# Patient Record
Sex: Female | Born: 1990 | Race: White | Hispanic: No | Marital: Married | Smoking: Never smoker
Health system: Southern US, Community
[De-identification: ages and names within clinical notes are randomized; demographics above are authoritative.]

## PROBLEM LIST (undated history)

## (undated) DIAGNOSIS — Z87898 Personal history of other specified conditions: Secondary | ICD-10-CM

## (undated) DIAGNOSIS — F429 Obsessive-compulsive disorder, unspecified: Secondary | ICD-10-CM

## (undated) DIAGNOSIS — K219 Gastro-esophageal reflux disease without esophagitis: Secondary | ICD-10-CM

## (undated) HISTORY — PX: CERVICAL DISC SURGERY: SHX588

## (undated) HISTORY — PX: WISDOM TOOTH EXTRACTION: SHX21

## (undated) HISTORY — DX: Personal history of other specified conditions: Z87.898

## (undated) HISTORY — PX: ESOPHAGOGASTRODUODENOSCOPY: SHX1529

## (undated) HISTORY — DX: Gastro-esophageal reflux disease without esophagitis: K21.9

## (undated) HISTORY — DX: Obsessive-compulsive disorder, unspecified: F42.9

---

## 2015-03-02 ENCOUNTER — Encounter: Payer: Self-pay | Admitting: Obstetrics and Gynecology

## 2015-03-02 ENCOUNTER — Ambulatory Visit (INDEPENDENT_AMBULATORY_CARE_PROVIDER_SITE_OTHER): Payer: 59 | Admitting: Obstetrics and Gynecology

## 2015-03-02 VITALS — BP 112/60 | HR 64 | Resp 14 | Ht 65.0 in | Wt 124.0 lb

## 2015-03-02 DIAGNOSIS — F42 Obsessive-compulsive disorder: Secondary | ICD-10-CM | POA: Diagnosis not present

## 2015-03-02 DIAGNOSIS — Z01419 Encounter for gynecological examination (general) (routine) without abnormal findings: Secondary | ICD-10-CM | POA: Diagnosis not present

## 2015-03-02 DIAGNOSIS — F429 Obsessive-compulsive disorder, unspecified: Secondary | ICD-10-CM

## 2015-03-02 DIAGNOSIS — Z124 Encounter for screening for malignant neoplasm of cervix: Secondary | ICD-10-CM | POA: Diagnosis not present

## 2015-03-02 MED ORDER — ORSYTHIA 0.1-20 MG-MCG PO TABS: 1.0000 | ORAL_TABLET | Freq: Every day | ORAL | Status: DC

## 2015-03-02 MED ORDER — FLUOXETINE HCL 20 MG PO CAPS: 20.0000 mg | ORAL_CAPSULE | Freq: Every day | ORAL | Status: DC

## 2015-03-02 NOTE — Progress Notes (Signed)
Patient ID: Gina Avila, female   DOB: 1990/11/24, 24 y.o.   MRN: [id number] 24 y.o. G0P0000 MarriedCaucasianF here for annual exam.  The patient is on OCP's. H/O benign breast lump, told fine. Newly married, no dyspareunia.  Period Cycle (Days): 28 Period Duration (Days): 5 days Period Pattern: Regular Menstrual Flow: Moderate Menstrual Control: Maxi pad, Tampon Dysmenorrhea: None Saturates a regular tampon in 2-3 hours, no BTB.  Patient's last menstrual period was 02/15/2015.          Sexually active: Yes.    The current method of family planning is OCP (estrogen/progesterone).    Exercising: No.  The patient does not participate in regular exercise at present. Smoker:  no  Health Maintenance: Pap:  unsure History of abnormal Pap:  no MMG:  Never Colonoscopy:  Never BMD:   Never TDaP:  unsure Gardasil: no    reports that she has never smoked. She has never used smokeless tobacco. She reports that she drinks about 0.6 oz of alcohol per week. She reports that she does not use illicit drugs. 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   Past Medical History  Diagnosis Date  . History of breast lump     Past Surgical History  Procedure Laterality Date  . Wisdom tooth extraction      Current Outpatient Prescriptions  Medication Sig Dispense Refill  . FLUoxetine (PROZAC) 20 MG capsule     . ORSYTHIA 0.1-20 MG-MCG tablet      No current facility-administered medications for this visit.    Family History  Problem Relation Age of Onset  . Breast cancer Mother   . Thyroid disease Mother   . Hypertension Father   . Thyroid disease Maternal Uncle   . Breast cancer Paternal Aunt   . Thyroid disease Maternal Grandmother     Review of Systems  Constitutional: Negative.   HENT: Negative.   Eyes: Negative.   Respiratory: Negative.   Cardiovascular: Negative.   Gastrointestinal: Negative.   Endocrine: Negative.    Genitourinary: Negative.   Musculoskeletal: Negative.   Skin: Negative.   Allergic/Immunologic: Negative.   Neurological: Negative.   Psychiatric/Behavioral: Negative.     Exam:   BP 112/60 mmHg  Pulse 64  Resp 14  Ht 5\' 5"  (1.651 m)  Wt 124 lb (56.246 kg)  BMI 20.63 kg/m2  LMP 02/15/2015  Weight change: @WEIGHTCHANGE @ Height:   Height: 5\' 5"  (165.1 cm)  Ht Readings from Last 3 Encounters:  03/02/15 5\' 5"  (1.651 m)    General appearance: alert, cooperative and appears stated age Head: Normocephalic, without obvious abnormality, atraumatic Neck: no adenopathy, supple, symmetrical, trachea midline and thyroid normal to inspection and palpation Lungs: clear to auscultation bilaterally Breasts: normal appearance, no masses or tenderness Heart: regular rate and rhythm Abdomen: soft, non-tender; bowel sounds normal; no masses,  no organomegaly Extremities: extremities normal, atraumatic, no cyanosis or edema Skin: Skin color, texture, turgor normal. No rashes or lesions Lymph nodes: Cervical, supraclavicular, and axillary nodes normal. No abnormal inguinal nodes palpated Neurologic: Grossly normal   Pelvic: External genitalia:  no lesions              Urethra:  normal appearing urethra with no masses, tenderness or lesions              Bartholins and Skenes: normal                 Vagina: normal appearing vagina with normal color  and discharge, no lesions              Cervix: no lesions               Bimanual Exam:  Uterus:  normal size, contour, position, consistency, mobility, non-tender              Adnexa: no mass, fullness, tenderness               Rectovaginal: Confirms               Anus:  normal sphincter tone, no lesions  Chaperone was present for exam.  A:  Well Woman with normal exam  Contraception, doing well on OCP's  OCD, well controlled on Prozac  P:   Continue OCP's  Pap with reflex hpv  No immediate plans for children, discussed prenatal vitamins if  considering stopping OCP's  Declines STD testing  Continue prozac

## 2015-03-03 LAB — IPS PAP TEST WITH REFLEX TO HPV

## 2015-07-06 ENCOUNTER — Telehealth: Payer: Self-pay

## 2015-07-06 NOTE — Telephone Encounter (Signed)
Fax Request for 90 day supply for Fluoxetine HCL 20 MG capsules.   Original Rx on 03/02/2015 JJ   Returned fax with instructions in comment section " Original Rx written on 03-02-2015 stated Dispense Quanity  #90 caps 3 Refills"   Fax sent successfully//kg

## 2015-08-08 ENCOUNTER — Encounter: Payer: Self-pay | Admitting: Obstetrics and Gynecology

## 2015-08-08 ENCOUNTER — Ambulatory Visit (INDEPENDENT_AMBULATORY_CARE_PROVIDER_SITE_OTHER): Payer: 59 | Admitting: Obstetrics and Gynecology

## 2015-08-08 VITALS — BP 92/60 | HR 80 | Resp 16 | Ht 65.0 in | Wt 126.0 lb

## 2015-08-08 DIAGNOSIS — L918 Other hypertrophic disorders of the skin: Secondary | ICD-10-CM

## 2015-08-08 NOTE — Progress Notes (Signed)
GYNECOLOGY  VISIT   HPI: 25 y.o.   Married  Caucasian  female   G0P0000 with Patient's last menstrual period was 08/02/2015.   here for Breast check. She c/o a 1 month h/o a pimple on her right nipple. Not tender, stable in size.   GYNECOLOGIC HISTORY: Patient's last menstrual period was 08/02/2015. Contraception: OCP Menopausal hormone therapy: None        OB History    Gravida Para Term Preterm AB TAB SAB Ectopic Multiple Living   0 0 0 0 0 0 0 0 0 0          Patient Active Problem List   Diagnosis Date Noted  . OCD (obsessive compulsive disorder)     Past Medical History  Diagnosis Date  . History of breast lump   . OCD (obsessive compulsive disorder)     Past Surgical History  Procedure Laterality Date  . Wisdom tooth extraction      Current Outpatient Prescriptions  Medication Sig Dispense Refill  . FLUoxetine (PROZAC) 20 MG capsule Take 1 capsule (20 mg total) by mouth daily. 90 capsule 3  . ORSYTHIA 0.1-20 MG-MCG tablet Take 1 tablet by mouth daily. 3 Package 3   No current facility-administered medications for this visit.     ALLERGIES: Codeine and Latex  Family History  Problem Relation Age of Onset  . Breast cancer Mother 59  . Thyroid disease Mother   . Hypertension Father   . Thyroid disease Maternal Uncle   . Breast cancer Paternal Aunt   . Thyroid disease Maternal Grandmother     Social History   Social History  . Marital Status: Married    Spouse Name: N/A  . Number of Children: N/A  . Years of Education: N/A   Occupational History  . Not on file.   Social History Main Topics  . Smoking status: Never Smoker   . Smokeless tobacco: Never Used  . Alcohol Use: 0.6 oz/week    1 Standard drinks or equivalent per week  . Drug Use: No  . Sexual Activity:    Partners: Male   Other Topics Concern  . Not on file   Social History Narrative    Review of Systems  Constitutional: Negative.   HENT: Negative.   Eyes: Negative.    Respiratory: Negative.   Cardiovascular: Negative.   Gastrointestinal: Negative.   Genitourinary: Negative.   Musculoskeletal: Negative.   Skin:       Mole like pimple - right breast   Neurological: Negative.   Endo/Heme/Allergies: Negative.   Psychiatric/Behavioral: Negative.     PHYSICAL EXAMINATION:    BP 92/60 mmHg  Pulse 80  Resp 16  Ht 5\' 5"  (1.651 m)  Wt 126 lb (57.153 kg)  BMI 20.97 kg/m2  LMP 08/02/2015    General appearance: alert, cooperative and appears stated age  Breasts: no lumps, dimpling or nipple d/c. She has a small skin tag on her right nipple around 2 o'clock. Benign appearing.   ASSESSMENT Skin tag right nipple FH of breast cancer in her mother at 79   PLAN Patient reassured Discussed breast self exams   An After Visit Summary was printed and given to the patient.

## 2015-10-21 ENCOUNTER — Telehealth: Payer: Self-pay | Admitting: Obstetrics and Gynecology

## 2015-10-21 NOTE — Telephone Encounter (Signed)
Spoke with patient. Patient is currently taking Orsytha for OCP. Reports she went out of town and left her pills pack. Has not taken her pills since Monday 10/17/2015. Started her cycle on 10/17/2015 and reports she is still bleeding. Reports bleeding is like her "normal" cycle. She is due to start her placebo pills on Sunday 10/23/2015. Patient is concerned about when to start her new pack so that she will not have her cycle again next week. Advised she may start a new pack Sunday. Will need to use a BUM during the first 7 days on her new pill. Patient is asking if she can start a new pack today so she is not missing more pills. Advised may start new pack today, but will need to change dates at the top of her pills pack. Advised I will speak with covering physician and return call with any further recommendations. She is agreeable.

## 2015-10-21 NOTE — Telephone Encounter (Signed)
Patient called with questions for the nurse about missing taking some of her birth control pills.

## 2015-10-21 NOTE — Telephone Encounter (Signed)
I agree with your recommendations.  Patient may start her new pill pack today.  Use back up pregnancy prevention as you outlined.

## 2015-10-21 NOTE — Telephone Encounter (Signed)
Left message to call Raquell Richer at [phone]. 

## 2016-01-14 ENCOUNTER — Other Ambulatory Visit: Payer: Self-pay | Admitting: Obstetrics and Gynecology

## 2016-01-16 NOTE — Telephone Encounter (Signed)
Please inform medication refill sent. Please set her up for an annual exam for the end of 9/17 or early 10/17

## 2016-01-16 NOTE — Telephone Encounter (Signed)
Medication refill request: FLUoxetine 20mg  Last AEX:  03/02/15 JJ ; 08/08/15 office visit for Skin tag Next AEX: none scheduled  Last MMG (if hormonal medication request): n/a Refill authorized: 03/02/15 #90 w/3; today please advise

## 2016-02-29 ENCOUNTER — Other Ambulatory Visit: Payer: Self-pay | Admitting: Obstetrics and Gynecology

## 2016-02-29 NOTE — Telephone Encounter (Signed)
1 pack of OCP's sent, please inform if needed Annual exam next week

## 2016-02-29 NOTE — Telephone Encounter (Signed)
Medication refill request: OCP  Last AEX:  03-02-15  Next AEX: 03-08-16 Last MMG (if hormonal medication request): N/A Refill authorized: please advise

## 2016-03-08 ENCOUNTER — Ambulatory Visit: Payer: 59 | Admitting: Obstetrics and Gynecology

## 2016-03-13 ENCOUNTER — Other Ambulatory Visit: Payer: Self-pay | Admitting: Obstetrics and Gynecology

## 2016-03-13 NOTE — Telephone Encounter (Signed)
Medication refill request: Prozac Last AEX:  03-02-15  Next AEX: 03-21-16 Last MMG (if hormonal medication request): N/A Refill authorized: please advise

## 2016-03-21 ENCOUNTER — Encounter: Payer: Self-pay | Admitting: Obstetrics and Gynecology

## 2016-03-21 ENCOUNTER — Ambulatory Visit (INDEPENDENT_AMBULATORY_CARE_PROVIDER_SITE_OTHER): Payer: 59 | Admitting: Obstetrics and Gynecology

## 2016-03-21 VITALS — BP 102/64 | HR 64 | Resp 14 | Ht 65.0 in | Wt 132.0 lb

## 2016-03-21 DIAGNOSIS — Z0000 Encounter for general adult medical examination without abnormal findings: Secondary | ICD-10-CM | POA: Diagnosis not present

## 2016-03-21 DIAGNOSIS — R5383 Other fatigue: Secondary | ICD-10-CM

## 2016-03-21 DIAGNOSIS — Z01419 Encounter for gynecological examination (general) (routine) without abnormal findings: Secondary | ICD-10-CM | POA: Diagnosis not present

## 2016-03-21 DIAGNOSIS — R635 Abnormal weight gain: Secondary | ICD-10-CM | POA: Diagnosis not present

## 2016-03-21 DIAGNOSIS — N941 Unspecified dyspareunia: Secondary | ICD-10-CM

## 2016-03-21 LAB — CBC
HCT: 42.4 % (ref 35.0–45.0)
Hemoglobin: 14.2 g/dL (ref 11.7–15.5)
MCH: 29.9 pg (ref 27.0–33.0)
MCHC: 33.5 g/dL (ref 32.0–36.0)
MCV: 89.3 fL (ref 80.0–100.0)
MPV: 11.3 fL (ref 7.5–12.5)
PLATELETS: 211 10*3/uL (ref 140–400)
RBC: 4.75 MIL/uL (ref 3.80–5.10)
RDW: 13.4 % (ref 11.0–15.0)
WBC: 7.7 10*3/uL (ref 3.8–10.8)

## 2016-03-21 LAB — TSH: TSH: 0.86 mIU/L

## 2016-03-21 MED ORDER — LEVONORGESTREL-ETHINYL ESTRAD 0.1-20 MG-MCG PO TABS
1.0000 | ORAL_TABLET | Freq: Every day | ORAL | 3 refills | Status: DC
Start: 2016-03-21 — End: 2017-02-06

## 2016-03-21 MED ORDER — FLUOXETINE HCL 20 MG PO CAPS: 20.0000 mg | ORAL_CAPSULE | Freq: Every day | ORAL | 3 refills | Status: DC

## 2016-03-21 NOTE — Progress Notes (Signed)
25 y.o. G0P0000 MarriedCaucasianF here for annual exam.  She c/o pain with intercourse, more with entry. Using lubricant, still hurts as the lubricant wears off. Able to orgasm. No immediate plans for babies. Low libido.  She has a 1 year h/o right shoulder pain, some tingling in her right hand. She has a muscle relaxant for prn use. Being evaluated. The tingling is random and short.  She is up 8 lbs in the last year. FH of thyroid dysfunction. Also c/o fatigue.  Period Cycle (Days): 28 Period Duration (Days): 4-5 days  Period Pattern: Regular Menstrual Flow: Moderate Menstrual Control: Maxi pad Dysmenorrhea: None  Patient's last menstrual period was 02/28/2016.          Sexually active: Yes.    The current method of family planning is OCP (estrogen/progesterone).    Exercising: Yes.    running Smoker:  no  Health Maintenance: Pap:  03-02-15 WNL History of abnormal Pap:  no TDaP:  unsure Gardasil: no    reports that she has never smoked. She has never used smokeless tobacco. She reports that she drinks about 2.4 oz of alcohol per week . She reports that she does not use drugs.Speech therapist, working in an elementary school.   Past Medical History:  Diagnosis Date  . History of breast lump   . OCD (obsessive compulsive disorder)     Past Surgical History:  Procedure Laterality Date  . WISDOM TOOTH EXTRACTION      Current Outpatient Prescriptions  Medication Sig Dispense Refill  . FLUoxetine (PROZAC) 20 MG capsule TAKE ONE CAPSULE BY MOUTH EVERY DAY 30 capsule 1  . SRONYX 0.1-20 MG-MCG tablet TAKE 1 TABLET BY MOUTH EVERY DAY 28 tablet 0  . tiZANidine (ZANAFLEX) 4 MG tablet Take 1 tablet twice daily as needed for neck/back stiffness/spasm     No current facility-administered medications for this visit.     Family History  Problem Relation Age of Onset  . Breast cancer Mother 37  . Thyroid disease Mother   . Hypertension Father   . Thyroid disease Maternal Uncle   .  Breast cancer Paternal Aunt   . Thyroid disease Maternal Grandmother   . Osteoporosis Maternal Grandmother     Review of Systems  Constitutional: Negative.   HENT: Negative.   Eyes: Negative.   Respiratory: Negative.   Cardiovascular: Negative.   Gastrointestinal: Negative.   Endocrine: Negative.   Genitourinary: Positive for dyspareunia.       Vaginal dryness  Musculoskeletal: Negative.   Skin: Negative.   Allergic/Immunologic: Negative.   Neurological: Negative.   Psychiatric/Behavioral: Negative.     Exam:   BP 102/64 (BP Location: Right Arm, Patient Position: Sitting, Cuff Size: Normal)   Pulse 64   Resp 14   Ht 5\' 5"  (1.651 m)   Wt 132 lb (59.9 kg)   LMP 02/28/2016   BMI 21.97 kg/m   Weight change: @WEIGHTCHANGE @ Height:   Height: 5\' 5"  (165.1 cm)  Ht Readings from Last 3 Encounters:  03/21/16 5\' 5"  (1.651 m)  08/08/15 5\' 5"  (1.651 m)  03/02/15 5\' 5"  (1.651 m)    General appearance: alert, cooperative and appears stated age Head: Normocephalic, without obvious abnormality, atraumatic Neck: no adenopathy, supple, symmetrical, trachea midline and thyroid normal to inspection and palpation Lungs: clear to auscultation bilaterally Breasts: normal appearance, no masses or tenderness Heart: regular rate and rhythm Abdomen: soft, non-tender; bowel sounds normal; no masses,  no organomegaly Extremities: extremities normal, atraumatic, no cyanosis or edema  Skin: Skin color, texture, turgor normal. No rashes or lesions Lymph nodes: Cervical, supraclavicular, and axillary nodes normal. No abnormal inguinal nodes palpated Neurologic: Grossly normal   Pelvic: External genitalia:  no lesions, no tenderness at the vestibule              Urethra:  normal appearing urethra with no masses, tenderness or lesions              Bartholins and Skenes: normal                 Vagina: normal appearing vagina with normal color and discharge, no lesions              Cervix: no  lesions               Bimanual Exam:  Uterus:  normal size, contour, position, consistency, mobility, non-tender              Adnexa: no mass, fullness, tenderness               Rectovaginal: Confirms               Anus:  normal sphincter tone, no lesions  Chaperone was present for exam.  A:  Well Woman with normal exam  Dyspareunia, entry, no point tenderness  Fatigue and weight gain  Contraception, doing well on OCP's    P:   CBC, CMP, vit D, Lipids and TSH  Discussed breast self exam  Discussed calcium and vit D intake  No pap this year  Discussed increasing foreplay, trying different lubricants and the patient controlling the rate and depth of penetration

## 2016-03-21 NOTE — Patient Instructions (Signed)

## 2016-03-22 ENCOUNTER — Telehealth: Payer: Self-pay | Admitting: Obstetrics and Gynecology

## 2016-03-22 LAB — LIPID PANEL
CHOLESTEROL: 180 mg/dL (ref 125–200)
HDL: 54 mg/dL (ref >=46)
LDL Cholesterol: 107 mg/dL (ref <130)
TRIGLYCERIDES: 96 mg/dL (ref <150)
Total CHOL/HDL Ratio: 3.3 Ratio (ref <=5.0)
VLDL: 19 mg/dL (ref <30)

## 2016-03-22 LAB — COMPREHENSIVE METABOLIC PANEL
ALK PHOS: 51 U/L (ref 33–115)
ALT: 8 U/L (ref 6–29)
AST: 19 U/L (ref 10–30)
Albumin: 4.3 g/dL (ref 3.6–5.1)
BUN: 9 mg/dL (ref 7–25)
CO2: 28 mmol/L (ref 20–31)
Calcium: 9.1 mg/dL (ref 8.6–10.2)
Chloride: 104 mmol/L (ref 98–110)
Creat: 0.82 mg/dL (ref 0.50–1.10)
GLUCOSE: 95 mg/dL (ref 65–99)
POTASSIUM: 3.7 mmol/L (ref 3.5–5.3)
SODIUM: 141 mmol/L (ref 135–146)
Total Bilirubin: 0.5 mg/dL (ref 0.2–1.2)
Total Protein: 7.3 g/dL (ref 6.1–8.1)

## 2016-03-22 LAB — VITAMIN D 25 HYDROXY (VIT D DEFICIENCY, FRACTURES): Vit D, 25-Hydroxy: 40 ng/mL (ref 30–100)

## 2016-03-22 NOTE — Telephone Encounter (Signed)
Patient has some questions regarding her results.

## 2016-03-22 NOTE — Telephone Encounter (Signed)
Spoke with patient. Patient is returning call regarding her results. Unsure if voicemail was regarding pap or lab results. Advised patient pap, TSH, CBC, CMP, Vit D, and lipid panel are all normal. Patient is agreeable and verbalizes understanding.  Routing to provider for final review. Patient agreeable to disposition. Will close encounter.

## 2016-03-28 ENCOUNTER — Other Ambulatory Visit: Payer: Self-pay | Admitting: Obstetrics and Gynecology

## 2016-05-22 ENCOUNTER — Other Ambulatory Visit: Payer: Self-pay | Admitting: Gastroenterology

## 2016-05-22 DIAGNOSIS — R110 Nausea: Secondary | ICD-10-CM

## 2016-10-16 ENCOUNTER — Other Ambulatory Visit: Payer: Self-pay | Admitting: Gastroenterology

## 2016-10-26 ENCOUNTER — Ambulatory Visit (HOSPITAL_COMMUNITY)
Admission: RE | Admit: 2016-10-26 | Discharge: 2016-10-26 | Disposition: A | Discharge status: Home / Self Care | Payer: BC Managed Care – PPO | Source: Ambulatory Visit | Attending: Gastroenterology | Admitting: Gastroenterology

## 2016-10-26 DIAGNOSIS — R110 Nausea: Secondary | ICD-10-CM

## 2016-10-26 DIAGNOSIS — K219 Gastro-esophageal reflux disease without esophagitis: Secondary | ICD-10-CM | POA: Insufficient documentation

## 2016-10-26 DIAGNOSIS — R079 Chest pain, unspecified: Secondary | ICD-10-CM | POA: Insufficient documentation

## 2016-10-26 DIAGNOSIS — R1011 Right upper quadrant pain: Secondary | ICD-10-CM | POA: Diagnosis not present

## 2016-10-26 MED ORDER — TECHNETIUM TC 99M MEBROFENIN IV KIT
5.3000 | PACK | Freq: Once | INTRAVENOUS | Status: AC | PRN
  Administered 2016-10-26: 5.3 via INTRAVENOUS

## 2016-10-31 ENCOUNTER — Other Ambulatory Visit: Payer: Self-pay | Admitting: Gastroenterology

## 2016-10-31 DIAGNOSIS — R935 Abnormal findings on diagnostic imaging of other abdominal regions, including retroperitoneum: Secondary | ICD-10-CM

## 2016-11-15 ENCOUNTER — Other Ambulatory Visit: Payer: BC Managed Care – PPO

## 2016-11-15 ENCOUNTER — Ambulatory Visit
Admission: RE | Admit: 2016-11-15 | Discharge: 2016-11-15 | Disposition: A | Discharge status: Home / Self Care | Payer: BC Managed Care – PPO | Source: Ambulatory Visit | Attending: Gastroenterology | Admitting: Gastroenterology

## 2016-11-15 DIAGNOSIS — R935 Abnormal findings on diagnostic imaging of other abdominal regions, including retroperitoneum: Secondary | ICD-10-CM

## 2016-11-15 MED ORDER — GADOXETATE DISODIUM 0.25 MMOL/ML IV SOLN
5.0000 mL | Freq: Once | INTRAVENOUS | Status: AC | PRN
  Administered 2016-11-15: 5 mL via INTRAVENOUS

## 2017-02-06 ENCOUNTER — Other Ambulatory Visit: Payer: Self-pay | Admitting: Obstetrics and Gynecology

## 2017-02-06 NOTE — Telephone Encounter (Signed)
Medication refill request: OCP   Last AEX:  03-21-16  Next AEX: 03-25-17  Last MMG (if hormonal medication request): N/A Refill authorized: please advise

## 2017-03-20 ENCOUNTER — Ambulatory Visit (INDEPENDENT_AMBULATORY_CARE_PROVIDER_SITE_OTHER): Payer: BC Managed Care – PPO | Admitting: Family Medicine

## 2017-03-20 ENCOUNTER — Encounter: Payer: Self-pay | Admitting: Family Medicine

## 2017-03-20 VITALS — BP 120/70 | HR 66 | Ht 66.0 in | Wt 122.8 lb

## 2017-03-20 DIAGNOSIS — R0982 Postnasal drip: Secondary | ICD-10-CM

## 2017-03-20 DIAGNOSIS — Z7689 Persons encountering health services in other specified circumstances: Secondary | ICD-10-CM

## 2017-03-20 DIAGNOSIS — K219 Gastro-esophageal reflux disease without esophagitis: Secondary | ICD-10-CM | POA: Diagnosis not present

## 2017-03-20 NOTE — Progress Notes (Signed)
   Subjective:    Patient ID: Gina Avila, female    DOB: 11/16/1990, 26 y.o.   MRN: [id number]  HPI Chief Complaint  Patient presents with  . new pt    new pt get established. dullness of pressure in head, she has a history of low blood sugars so has to eat every 3 hours so the dullness helps when she eats   She is new to the practice and here to establish care.  Previous medical care: in [place], [place].  Last CPE: years ago   Complains of feeling "foggy" and having a generalized dull pressure in her head for the past 2 weeks.  Also reports having post nasal drip, ear popping, sore throat, and mild sinus pressure.   Denies fever, chills, body aches, dizziness, rhinorrhea, cough, chest pain, palpitations, shortness of breath, abdominal pain, N/V/D.   No history of seasonal allergies.   States she started taking omeprazole last fall for GERD. Does not feel like the medication is helping and would like to wean off of this.   Other providers:  OB/GYN- Dr. Talbert Nan Dr. Collene Mares- GI  Antionette Char.  [facility] dermatologist.    Reports having gallbladder issues - states she eats a low fat diet and this helps with any symptoms she has been having.    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   Last Menstrual cycle: 03/05/2017  Reviewed allergies, medications, past medical, surgical, family, and social history.   Review of Systems Pertinent positives and negatives in the history of present illness.     Objective:   Physical Exam BP 120/70   Pulse 66   Ht 5\' 6"  (1.676 m)   Wt 122 lb 12.8 oz (55.7 kg)   LMP 03/05/2017   BMI 19.82 kg/m   Alert and in no distress. No sinus tenderness. Nares patent, mild erythema, no discharge. Tympanic membranes and canals are normal. Pharyngeal area is erythematous, no edema or exudate.  Neck is supple without adenopathy or thyromegaly. Cardiac exam shows a regular sinus rhythm without murmurs or gallops. Lungs are  clear to auscultation.      Assessment & Plan:  Post-nasal drainage  Encounter to establish care  Gastroesophageal reflux disease, esophagitis presence not specified  Discussed that post nasal drainage may be related to underlying sinus infection vs allergies. She declines antibiotic for now and would like to try an antihistamine. She will try Claritin and let me know if her symptoms are not improving.  Discussed symptomatic treatment as well such as salt water gargles, tylenol or ibuprofen as needed.  She may cut back on her PPI and try to control GERD with lifestyle and diet.  Follow up as needed.

## 2017-03-20 NOTE — Patient Instructions (Signed)
You can try Claritin and see if this helps with the drainage and symptoms.    For reflux, continue avoiding food triggers and you can try cutting back on the omeprazole as tolerated.

## 2017-03-25 ENCOUNTER — Ambulatory Visit (INDEPENDENT_AMBULATORY_CARE_PROVIDER_SITE_OTHER): Payer: BC Managed Care – PPO | Admitting: Obstetrics and Gynecology

## 2017-03-25 ENCOUNTER — Encounter: Payer: Self-pay | Admitting: Obstetrics and Gynecology

## 2017-03-25 VITALS — BP 100/58 | HR 68 | Resp 14 | Ht 65.0 in | Wt 119.0 lb

## 2017-03-25 DIAGNOSIS — Z3041 Encounter for surveillance of contraceptive pills: Secondary | ICD-10-CM | POA: Diagnosis not present

## 2017-03-25 DIAGNOSIS — Z01419 Encounter for gynecological examination (general) (routine) without abnormal findings: Secondary | ICD-10-CM

## 2017-03-25 MED ORDER — LEVONORGESTREL-ETHINYL ESTRAD 0.1-20 MG-MCG PO TABS: 1.0000 | ORAL_TABLET | Freq: Every day | ORAL | 3 refills | Status: DC

## 2017-03-25 NOTE — Patient Instructions (Signed)
EXERCISE AND DIET:  We recommended that you start or continue a regular exercise program for good health. Regular exercise means any activity that makes your heart beat faster and makes you sweat.  We recommend exercising at least 30 minutes per day at least 3 days a week, preferably 4 or 5.  We also recommend a diet low in fat and sugar.  Inactivity, poor dietary choices and obesity can cause diabetes, heart attack, stroke, and kidney damage, among others.    ALCOHOL AND SMOKING:  Women should limit their alcohol intake to no more than 7 drinks/beers/glasses of wine (combined, not each!) per week. Moderation of alcohol intake to this level decreases your risk of breast cancer and liver damage. And of course, no recreational drugs are part of a healthy lifestyle.  And absolutely no smoking or even second hand smoke. Most people know smoking can cause heart and lung diseases, but did you know it also contributes to weakening of your bones? Aging of your skin?  Yellowing of your teeth and nails?  CALCIUM AND VITAMIN D:  Adequate intake of calcium and Vitamin D are recommended.  The recommendations for exact amounts of these supplements seem to change often, but generally speaking 600 mg of calcium (either carbonate or citrate) and 800 units of Vitamin D per day seems prudent. Certain women may benefit from higher intake of Vitamin D.  If you are among these women, your doctor will have told you during your visit.    PAP SMEARS:  Pap smears, to check for cervical cancer or precancers,  have traditionally been done yearly, although recent scientific advances have shown that most women can have pap smears less often.  However, every woman still should have a physical exam from her gynecologist every year. It will include a breast check, inspection of the vulva and vagina to check for abnormal growths or skin changes, a visual exam of the cervix, and then an exam to evaluate the size and shape of the uterus and  ovaries.  And after 27 years of age, a rectal exam is indicated to check for rectal cancers. We will also provide age appropriate advice regarding health maintenance, like when you should have certain vaccines, screening for sexually transmitted diseases, bone density testing, colonoscopy, mammograms, etc.   MAMMOGRAMS:  All women over 40 years old should have a yearly mammogram. Many facilities now offer a "3D" mammogram, which may cost around $50 extra out of pocket. If possible,  we recommend you accept the option to have the 3D mammogram performed.  It both reduces the number of women who will be called back for extra views which then turn out to be normal, and it is better than the routine mammogram at detecting truly abnormal areas.    COLONOSCOPY:  Colonoscopy to screen for colon cancer is recommended for all women at age 50.  We know, you hate the idea of the prep.  We agree, BUT, having colon cancer and not knowing it is worse!!  Colon cancer so often starts as a polyp that can be seen and removed at colonscopy, which can quite literally save your life!  And if your first colonoscopy is normal and you have no family history of colon cancer, most women don't have to have it again for 10 years.  Once every ten years, you can do something that may end up saving your life, right?  We will be happy to help you get it scheduled when you are ready.    Be sure to check your insurance coverage so you understand how much it will cost.  It may be covered as a preventative service at no cost, but you should check your particular policy.      Breast Self-Awareness Breast self-awareness means being familiar with how your breasts look and feel. It involves checking your breasts regularly and reporting any changes to your health care provider. Practicing breast self-awareness is important. A change in your breasts can be a sign of a serious medical problem. Being familiar with how your breasts look and feel allows  you to find any problems early, when treatment is more likely to be successful. All women should practice breast self-awareness, including women who have had breast implants. How to do a breast self-exam One way to learn what is normal for your breasts and whether your breasts are changing is to do a breast self-exam. To do a breast self-exam: Look for Changes  1. Remove all the clothing above your waist. 2. Stand in front of a mirror in a room with good lighting. 3. Put your hands on your hips. 4. Push your hands firmly downward. 5. Compare your breasts in the mirror. Look for differences between them (asymmetry), such as: ? Differences in shape. ? Differences in size. ? Puckers, dips, and bumps in one breast and not the other. 6. Look at each breast for changes in your skin, such as: ? Redness. ? Scaly areas. 7. Look for changes in your nipples, such as: ? Discharge. ? Bleeding. ? Dimpling. ? Redness. ? A change in position. Feel for Changes  Carefully feel your breasts for lumps and changes. It is best to do this while lying on your back on the floor and again while sitting or standing in the shower or tub with soapy water on your skin. Feel each breast in the following way:  Place the arm on the side of the breast you are examining above your head.  Feel your breast with the other hand.  Start in the nipple area and make  inch (2 cm) overlapping circles to feel your breast. Use the pads of your three middle fingers to do this. Apply light pressure, then medium pressure, then firm pressure. The light pressure will allow you to feel the tissue closest to the skin. The medium pressure will allow you to feel the tissue that is a little deeper. The firm pressure will allow you to feel the tissue close to the ribs.  Continue the overlapping circles, moving downward over the breast until you feel your ribs below your breast.  Move one finger-width toward the center of the body.  Continue to use the  inch (2 cm) overlapping circles to feel your breast as you move slowly up toward your collarbone.  Continue the up and down exam using all three pressures until you reach your armpit.  Write Down What You Find  Write down what is normal for each breast and any changes that you find. Keep a written record with breast changes or normal findings for each breast. By writing this information down, you do not need to depend only on memory for size, tenderness, or location. Write down where you are in your menstrual cycle, if you are still menstruating. If you are having trouble noticing differences in your breasts, do not get discouraged. With time you will become more familiar with the variations in your breasts and more comfortable with the exam. How often should I examine my breasts? Examine   your breasts every month. If you are breastfeeding, the best time to examine your breasts is after a feeding or after using a breast pump. If you menstruate, the best time to examine your breasts is 5-7 days after your period is over. During your period, your breasts are lumpier, and it may be more difficult to notice changes. When should I see my health care provider? See your health care provider if you notice:  A change in shape or size of your breasts or nipples.  A change in the skin of your breast or nipples, such as a reddened or scaly area.  Unusual discharge from your nipples.  A lump or thick area that was not there before.  Pain in your breasts.  Anything that concerns you.  This information is not intended to replace advice given to you by your health care provider. Make sure you discuss any questions you have with your health care provider. Document Released: 05/21/2005 Document Revised: 10/27/2015 Document Reviewed: 04/10/2015 Elsevier Interactive Patient Education  2018 Elsevier Inc.  

## 2017-03-25 NOTE — Progress Notes (Signed)
26 y.o. G0P0000 MarriedCaucasianF here for annual exam.  Doing well on OCP's.  Period Cycle (Days): 28 Period Duration (Days): 7 days  Period Pattern: Regular Menstrual Flow: Moderate Menstrual Control: Thin pad Menstrual Control Change Freq (Hours): changes pad every 3-4 times a day Dysmenorrhea: None  She is still having some pain with intercourse on entry or after a while when the lubricant wears out. If she has enough lubricant she is okay. Using non latex condoms secondary to concern with pregnancy.  She found a lump in her right breast 5 years ago, they saw fibrous changes in the left breast. She is sending a copy of the reports here. She wasn't told she needed f/u imaging.  Mom with breast cancer at 22, triple negative. Mom with negative breast cancer.  PAunt with breast cancer in her 24's.   Patient's last menstrual period was 03/05/2017.          Sexually active: Yes.    The current method of family planning is OCP (estrogen/progesterone).    Exercising: Yes.    beach body  Smoker:  no  Health Maintenance: Pap:  03-02-15 WNL  History of abnormal Pap:  no TDaP:  Unsure, thinks due Gardasil: no    reports that she has never smoked. She has never used smokeless tobacco. She reports that she drinks about 1.2 oz of alcohol per week . She reports that she does not use drugs.Speech therapist in an elementary school.   Past Medical History:  Diagnosis Date  . GERD (gastroesophageal reflux disease)   . History of breast lump   . OCD (obsessive compulsive disorder)     Past Surgical History:  Procedure Laterality Date  . ESOPHAGOGASTRODUODENOSCOPY     Dr. Collene Mares  . WISDOM TOOTH EXTRACTION      Current Outpatient Prescriptions  Medication Sig Dispense Refill  . omeprazole (PRILOSEC) 40 MG capsule Take 40 mg by mouth daily.    . SRONYX 0.1-20 MG-MCG tablet TAKE 1 TABLET BY MOUTH EVERY DAY 84 tablet 0   No current facility-administered medications for this visit.     Family  History  Problem Relation Age of Onset  . Breast cancer Mother 79  . Thyroid disease Mother   . Hypertension Father   . Thyroid disease Maternal Uncle   . Breast cancer Paternal Aunt   . Thyroid disease Maternal Grandmother   . Osteoporosis Maternal Grandmother     Review of Systems  Constitutional: Negative.   HENT: Negative.   Eyes: Negative.   Respiratory: Negative.   Cardiovascular: Negative.   Gastrointestinal: Negative.   Endocrine: Negative.   Genitourinary: Positive for dyspareunia.       Loss of sexual interest   Musculoskeletal: Positive for myalgias.  Skin: Negative.   Allergic/Immunologic: Negative.   Neurological: Positive for headaches.  Psychiatric/Behavioral: Negative.     Exam:   BP (!) 100/58 (BP Location: Right Arm, Patient Position: Sitting, Cuff Size: Normal)   Pulse 68   Resp 14   Ht 5\' 5"  (1.651 m)   Wt 119 lb (54 kg)   LMP 03/05/2017   BMI 19.80 kg/m   Weight change: @WEIGHTCHANGE @ Height:   Height: 5\' 5"  (165.1 cm)  Ht Readings from Last 3 Encounters:  03/25/17 5\' 5"  (1.651 m)  03/20/17 5\' 6"  (1.676 m)  03/21/16 5\' 5"  (1.651 m)    General appearance: alert, cooperative and appears stated age Head: Normocephalic, without obvious abnormality, atraumatic Neck: no adenopathy, supple, symmetrical, trachea midline and thyroid  normal to inspection and palpation Lungs: clear to auscultation bilaterally Cardiovascular: regular rate and rhythm Breasts: normal appearance, no masses or tenderness Abdomen: soft, non-tender; non distended,  no masses,  no organomegaly Extremities: extremities normal, atraumatic, no cyanosis or edema Skin: Skin color, texture, turgor normal. No rashes or lesions Lymph nodes: Cervical, supraclavicular, and axillary nodes normal. No abnormal inguinal nodes palpated Neurologic: Grossly normal   Pelvic: External genitalia:  no lesions              Urethra:  normal appearing urethra with no masses, tenderness or  lesions              Bartholins and Skenes: normal                 Vagina: normal appearing vagina with normal color and discharge, no lesions              Cervix: no lesions               Bimanual Exam:  Uterus:  normal size, contour, position, consistency, mobility, non-tender              Adnexa: no mass, fullness, tenderness               Rectovaginal: Confirms               Anus:  normal sphincter tone, no lesions  Chaperone was present for exam.  A:  Well Woman with normal exam  Contraception, doing well on OCP's  Some entry dyspareunia intermittently, pain if lubricant dries up. May be having some discomfort from the condoms  P:   She will do her labs and get her TDAP with her primary  Continue OCP's  Discussed lubrication, try not using condoms  Discussed breast self exam  Discussed calcium and vit D intake  F/U one year, sooner with concerns  She is having her prior breast imaging sent here for review.

## 2017-04-02 ENCOUNTER — Encounter: Payer: Self-pay | Admitting: Family Medicine

## 2017-04-02 ENCOUNTER — Ambulatory Visit (INDEPENDENT_AMBULATORY_CARE_PROVIDER_SITE_OTHER): Payer: BC Managed Care – PPO | Admitting: Family Medicine

## 2017-04-02 VITALS — BP 120/70 | HR 64 | Ht 66.25 in | Wt 123.2 lb

## 2017-04-02 DIAGNOSIS — Z0000 Encounter for general adult medical examination without abnormal findings: Secondary | ICD-10-CM | POA: Diagnosis not present

## 2017-04-02 DIAGNOSIS — Z8349 Family history of other endocrine, nutritional and metabolic diseases: Secondary | ICD-10-CM

## 2017-04-02 LAB — COMPREHENSIVE METABOLIC PANEL
AG Ratio: 1.4 (calc) (ref 1.0–2.5)
ALKALINE PHOSPHATASE (APISO): 44 U/L (ref 33–115)
ALT: 8 U/L (ref 6–29)
AST: 17 U/L (ref 10–30)
Albumin: 4.2 g/dL (ref 3.6–5.1)
BUN: 11 mg/dL (ref 7–25)
CHLORIDE: 105 mmol/L (ref 98–110)
CO2: 25 mmol/L (ref 20–32)
CREATININE: 0.86 mg/dL (ref 0.50–1.10)
Calcium: 9 mg/dL (ref 8.6–10.2)
Globulin: 2.9 g/dL (calc) (ref 1.9–3.7)
Glucose, Bld: 91 mg/dL (ref 65–99)
Potassium: 4 mmol/L (ref 3.5–5.3)
Sodium: 139 mmol/L (ref 135–146)
Total Bilirubin: 0.3 mg/dL (ref 0.2–1.2)
Total Protein: 7.1 g/dL (ref 6.1–8.1)

## 2017-04-02 LAB — CBC WITH DIFFERENTIAL/PLATELET
BASOS ABS: 40 {cells}/uL (ref 0–200)
Basophils Relative: 0.5 %
EOS ABS: 48 {cells}/uL (ref 15–500)
Eosinophils Relative: 0.6 %
HEMATOCRIT: 41 % (ref 35.0–45.0)
HEMOGLOBIN: 13.8 g/dL (ref 11.7–15.5)
LYMPHS ABS: 2288 {cells}/uL (ref 850–3900)
MCH: 30 pg (ref 27.0–33.0)
MCHC: 33.7 g/dL (ref 32.0–36.0)
MCV: 89.1 fL (ref 80.0–100.0)
MPV: 12.7 fL — ABNORMAL HIGH (ref 7.5–12.5)
Monocytes Relative: 7.6 %
NEUTROS ABS: 5016 {cells}/uL (ref 1500–7800)
NEUTROS PCT: 62.7 %
Platelets: 170 10*3/uL (ref 140–400)
RBC: 4.6 10*6/uL (ref 3.80–5.10)
RDW: 12.5 % (ref 11.0–15.0)
Total Lymphocyte: 28.6 %
WBC mixed population: 608 cells/uL (ref 200–950)
WBC: 8 10*3/uL (ref 3.8–10.8)

## 2017-04-02 LAB — TSH: TSH: 1.12 m[IU]/L

## 2017-04-02 LAB — POCT URINALYSIS DIP (PROADVANTAGE DEVICE)
BILIRUBIN UA: NEGATIVE mg/dL
Bilirubin, UA: NEGATIVE
Glucose, UA: NEGATIVE mg/dL
Nitrite, UA: NEGATIVE
PH UA: 6 (ref 5.0–8.0)
PROTEIN UA: NEGATIVE mg/dL
SPECIFIC GRAVITY, URINE: 1.02
Urobilinogen, Ur: NEGATIVE

## 2017-04-02 NOTE — Progress Notes (Signed)
Subjective:    Patient ID: Gina Avila, female    DOB: 1991/03/16, 26 y.o.   MRN: [id number]  HPI Chief Complaint  Patient presents with  . cpe    fasting cpe, sees obgyn   She is here for a complete physical exam. Taking omeprazole every other day and GERD symptoms are controlled.   Other providers:  OB/GYN- Dr. Talbert Nan Dr. Collene Mares- GI  Antionette Char.  [facility] - [facility] dermatologist.  Social history: Lives with husband, works as Astronomer. Colen Darling degree from [employer] and graduate degree [employer].  Diet: low fat diet  Excerise: 3-4 days per week   Immunizations: up to date. Last Tdap 8 years ago approximately  Health maintenance:  Mammogram: US done for benign tissue abnormalities per patient   Last Gynecological Exam: Pap smear- 2016 Last Menstrual cycle: regular and on OCPs  Last Dental Exam: last week  Last Eye Exam: last year   Wears seatbelt always, uses sunscreen, smoke detectors in home and functioning, does not text while driving and feels safe in home environment.   Reviewed allergies, medications, past medical, surgical, family, and social history.   Review of Systems Review of Systems Constitutional: -fever, -chills, -sweats, -unexpected weight change,-fatigue ENT: -runny nose, -ear pain, -sore throat Cardiology:  -chest pain, -palpitations, -edema Respiratory: -cough, -shortness of breath, -wheezing Gastroenterology: -abdominal pain, -nausea, -vomiting, -diarrhea, -constipation  Hematology: -bleeding or bruising problems Musculoskeletal: -arthralgias, -myalgias, -joint swelling, -back pain Ophthalmology: -vision changes Urology: -dysuria, -difficulty urinating, -hematuria, -urinary frequency, -urgency Neurology: -headache, -weakness, -tingling, -numbness       Objective:   Physical Exam BP 120/70   Pulse 64   Ht 5' 6.25" (1.683 m)   Wt 123 lb 3.2 oz (55.9 kg)   LMP 03/19/2017   BMI 19.74 kg/m   General Appearance:    Alert, cooperative,  no distress, appears stated age  Head:    Normocephalic, without obvious abnormality, atraumatic  Eyes:    PERRL, conjunctiva/corneas clear, EOM's intact, fundi    benign  Ears:    Normal TM's and external ear canals  Nose:   Nares normal, mucosa normal, no drainage or sinus   tenderness  Throat:   Lips, mucosa, and tongue normal; teeth and gums normal  Neck:   Supple, no lymphadenopathy;  thyroid:  no   enlargement/tenderness/nodules; no carotid   bruit or JVD  Back:    Spine nontender, no curvature, ROM normal, no CVA     tenderness  Lungs:     Clear to auscultation bilaterally without wheezes, rales or     ronchi; respirations unlabored  Chest Wall:    No tenderness or deformity   Heart:    Regular rate and rhythm, S1 and S2 normal, no murmur, rub   or gallop  Breast Exam:    Done at OB/GYN  Abdomen:     Soft, non-tender, nondistended, normoactive bowel sounds,    no masses, no hepatosplenomegaly  Genitalia:    Done at OB/GYN  Rectal:    Not performed due to age<40 and no related complaints  Extremities:   No clubbing, cyanosis or edema  Pulses:   2+ and symmetric all extremities  Skin:   Skin color, texture, turgor normal, no rashes or lesions  Lymph nodes:   Cervical, supraclavicular, and axillary nodes normal  Neurologic:   CNII-XII intact, normal strength, sensation and gait; reflexes 2+ and symmetric throughout          Psych:   Normal mood, affect, hygiene  and grooming.    Urinalysis dipstick: trace leuk and blood     Assessment & Plan:  Routine general medical examination at a health care facility - Plan: CBC with Differential/Platelet, Comprehensive metabolic panel, TSH, POCT Urinalysis DIP (Proadvantage Device)  Family history of thyroid disease in mother - Plan: TSH  She appears to be doing well.  Discussed safety and health promotion.  Recommend she continue healthy diet and exercise.  She has an OB/GYN and will follow up as recommended.  Follow up pending labs.

## 2017-04-02 NOTE — Patient Instructions (Signed)

## 2017-06-07 ENCOUNTER — Ambulatory Visit: Payer: BC Managed Care – PPO | Admitting: Family Medicine

## 2017-06-07 ENCOUNTER — Encounter: Payer: Self-pay | Admitting: Family Medicine

## 2017-06-07 VITALS — BP 118/70 | HR 58 | Wt 118.8 lb

## 2017-06-07 DIAGNOSIS — H6691 Otitis media, unspecified, right ear: Secondary | ICD-10-CM

## 2017-06-07 MED ORDER — AMOXICILLIN 875 MG PO TABS: 875.0000 mg | ORAL_TABLET | Freq: Two times a day (BID) | ORAL | 0 refills | Status: DC

## 2017-06-07 NOTE — Progress Notes (Signed)
   Subjective:    Patient ID: Gina Avila, female    DOB: 1991/04/18, 27 y.o.   MRN: [id number]  HPI Chief Complaint  Patient presents with  . water in ear    rt water in ear x 2 weeks    She is here with complaints of a 2 week history of popping in her right ear. States she got water in her right ear in the shower at that time. She is now having pain.   Denies fever, chills, headache, rhinorrhea, nasal congestion, dizziness, sore throat, cough.   Reviewed allergies, medications, past medical, surgical, and social history.   Review of Systems Pertinent positives and negatives in the history of present illness.     Objective:   Physical Exam BP 118/70   Pulse (!) 58   Wt 118 lb 12.8 oz (53.9 kg)   SpO2 98%   BMI 19.03 kg/m   Alert and in no distress.  No sinus tenderness.  Nares patent.  Right TM is cloudy, dull and with erythema, left TM normal, canals are normal. Pharyngeal area is normal. Neck is supple without adenopathy or thyromegaly.       Assessment & Plan:  Acute otitis media, right - Plan: amoxicillin (AMOXIL) 875 MG tablet  Discussed management and treatment of acute otitis media.  Amoxicillin sent to pharmacy.  She will follow-up if not back to baseline after completing antibiotic or if she gets worse.

## 2017-06-26 ENCOUNTER — Ambulatory Visit: Payer: BC Managed Care – PPO | Admitting: Medical

## 2017-07-05 IMAGING — US US ABDOMEN COMPLETE
1 series · 13 of 25 positions shown · non-contrast
Comparison: No prior .

CLINICAL DATA: Nausea.

EXAM:
ABDOMEN ULTRASOUND COMPLETE

[Series 1: us abdomen complete · 0.15mm/px · 109 acquisitions, 13 frames shown]
[im 1/109]
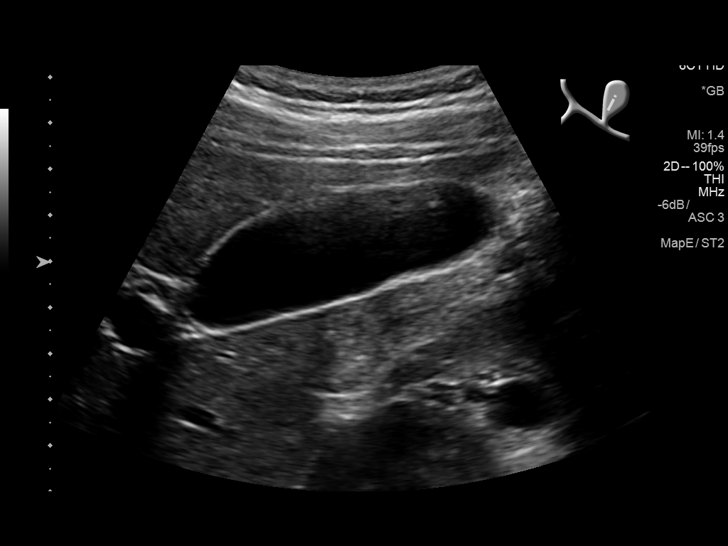
[im 10/109]
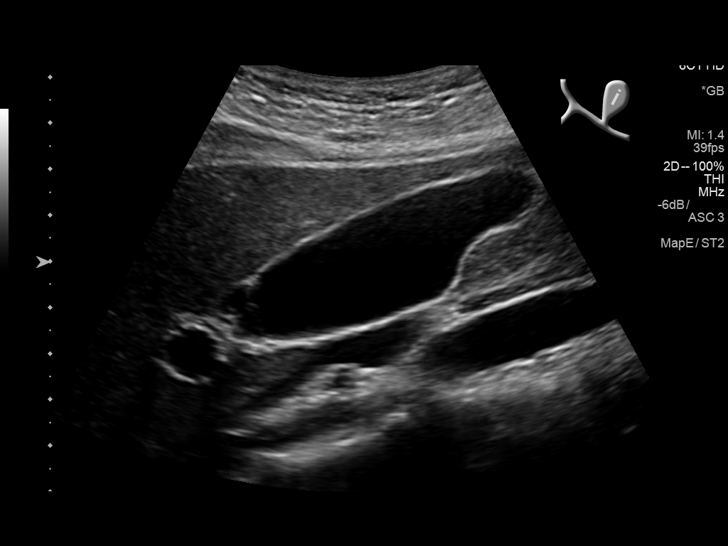
[im 19/109]
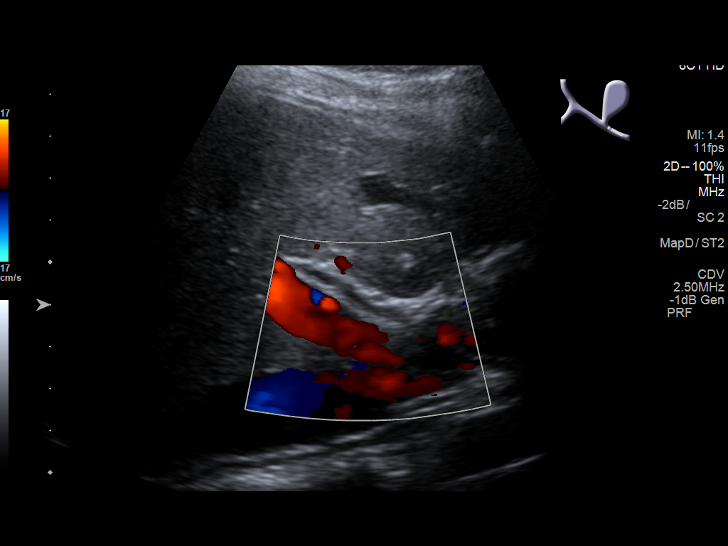
[im 28/109]
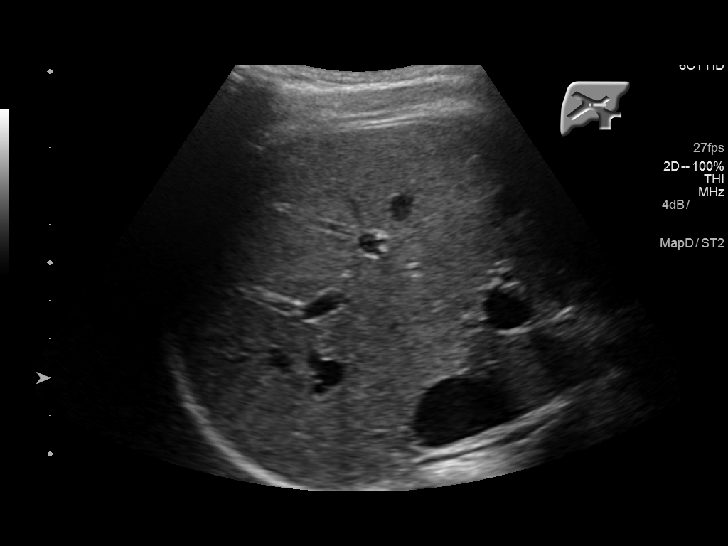
[im 37/109]
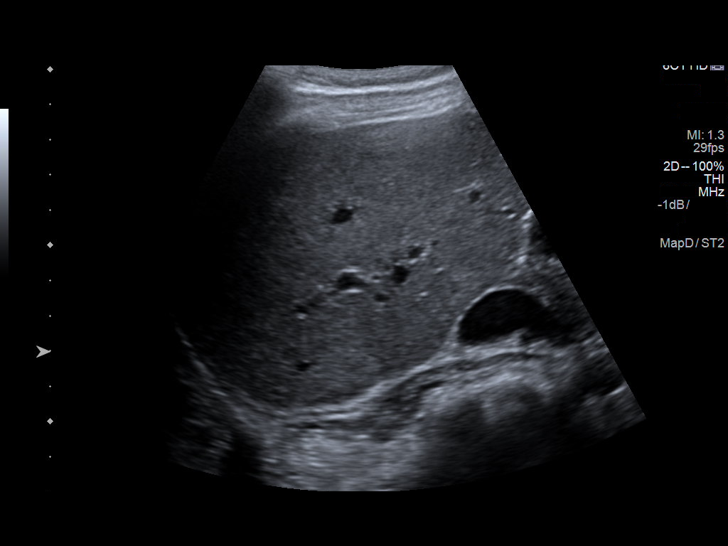
[im 46/109]
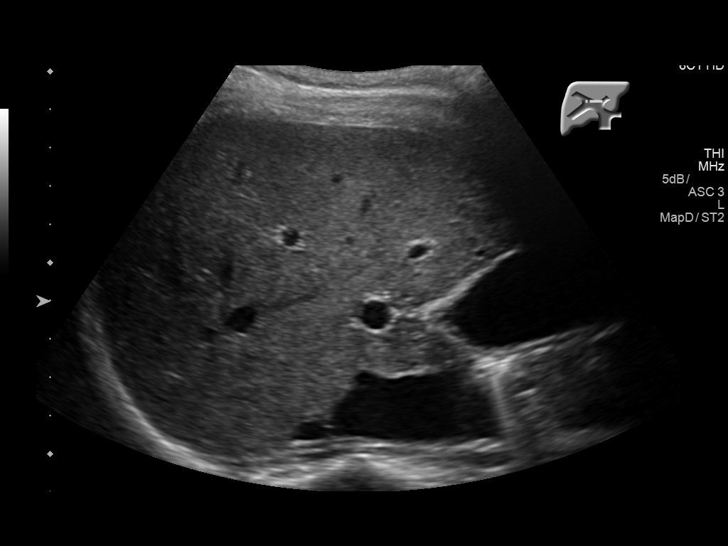
[im 55/109]
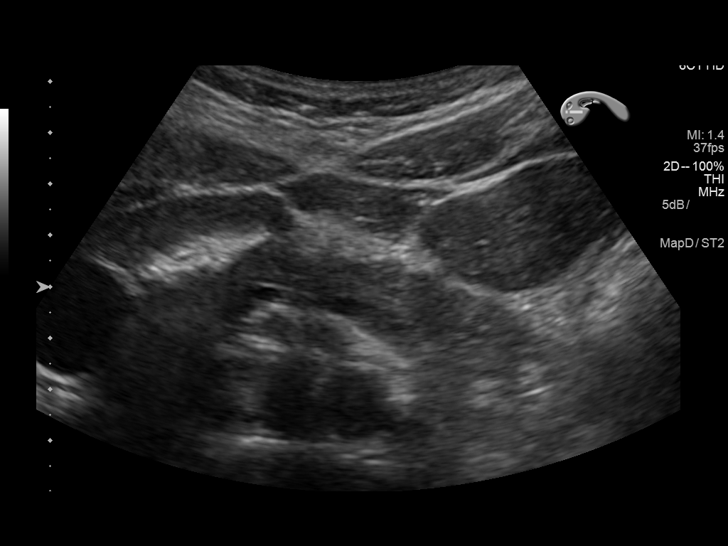
[im 64/109]
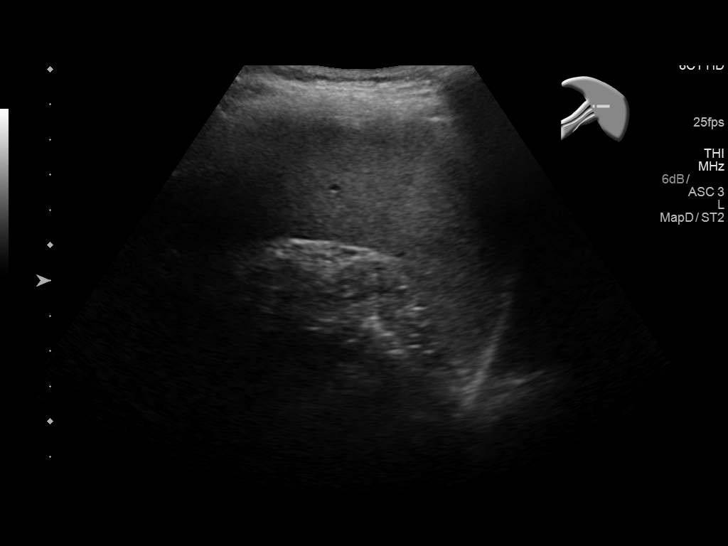
[im 73/109]
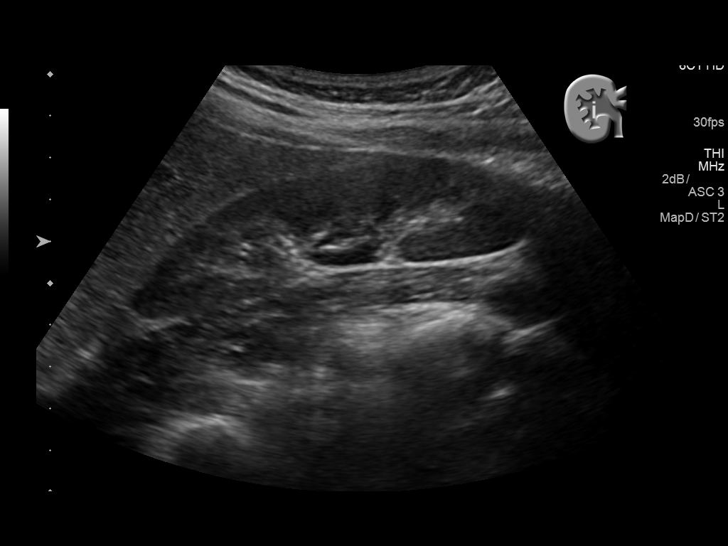
[im 82/109]
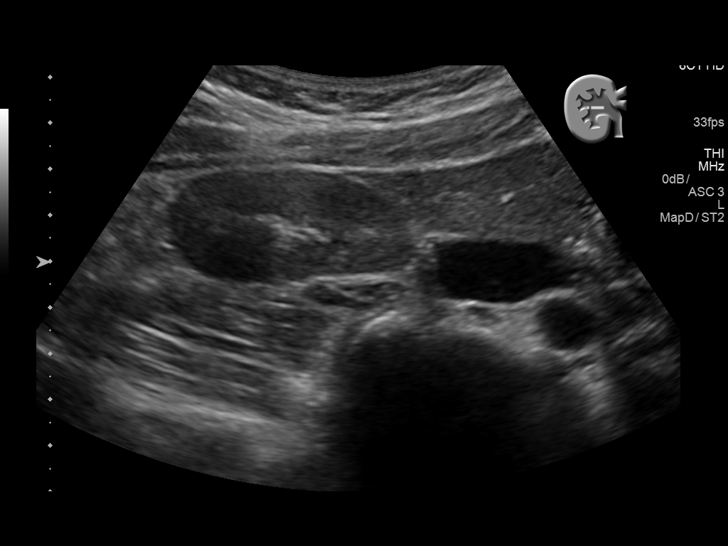
[im 91/109]
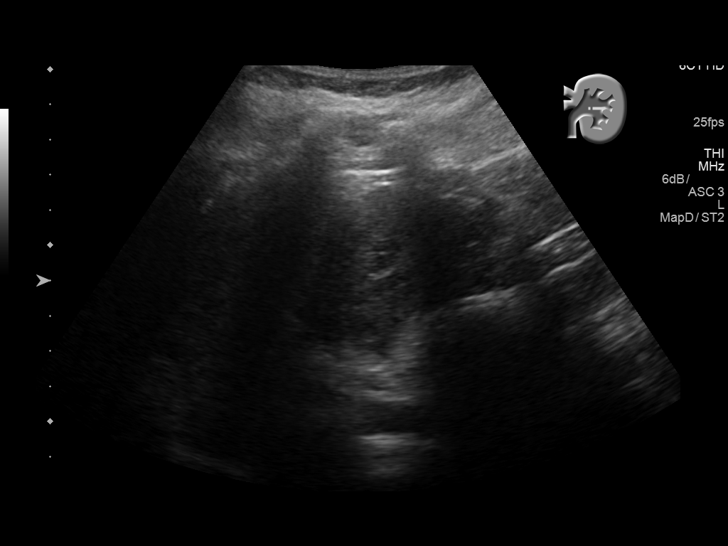
[im 100/109]
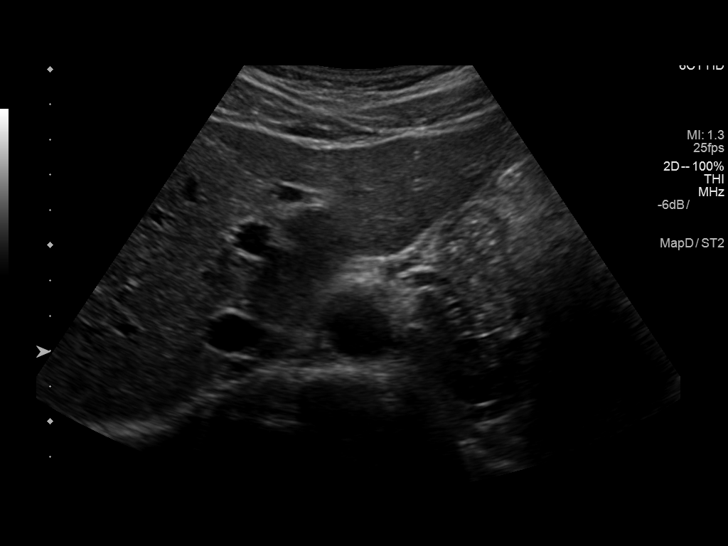
[im 109/109]
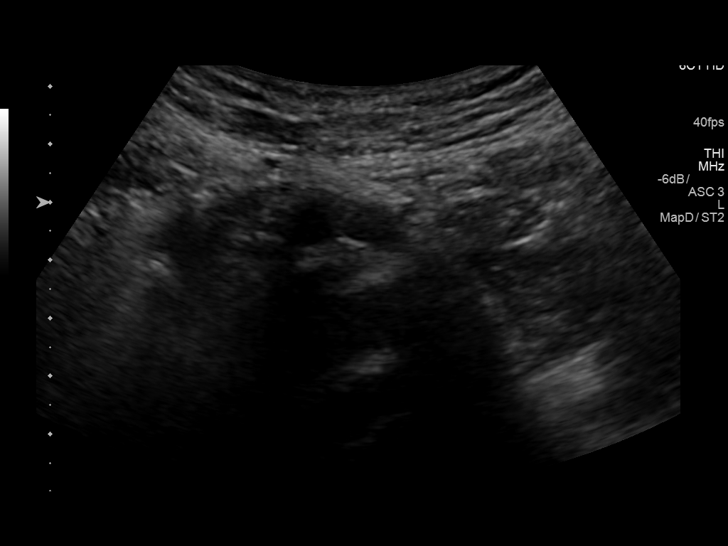

[13 of 25 positions shown; findings below may reference images not displayed]

FINDINGS: Gallbladder: No gallstones or wall thickening visualized. No
sonographic Murphy sign noted by sonographer.

Common bile duct: Diameter: 2.5 mm

Liver: Increased echogenicity consistent fatty infiltration and/or
hepatocellular disease. 2.5 x 1.4 x 2.4 cm hyperechoic
well-circumscribed lesion is in the right hepatic lobe. This is most
likely a benign hemangioma, confirmation with gadolinium-enhanced
MRI can be obtained for further evaluation .

IVC: No abnormality visualized.

Pancreas: Visualized portion unremarkable.

Spleen: Size and appearance within normal limits.

Right Kidney: Length: 10.0 cm. Echogenicity within normal limits. No
mass or hydronephrosis visualized.

Left Kidney: Length: 10.5 cm. Echogenicity within normal limits. No
mass or hydronephrosis visualized.

Abdominal aorta: No aneurysm visualized.

Other findings: None.
IMPRESSION: 1. No acute abnormality. No evidence of gallstones or biliary
distention.

2. 2.5 cm hyperechoic well-circumscribed lesion in the right lobe
liver, most likely a benign hemangioma. Confirmation with
gadolinium-enhanced MRI can be obtained.

## 2017-08-28 ENCOUNTER — Ambulatory Visit: Payer: BC Managed Care – PPO | Admitting: Family Medicine

## 2017-08-28 ENCOUNTER — Ambulatory Visit: Payer: BC Managed Care – PPO | Admitting: Medical

## 2017-08-28 ENCOUNTER — Encounter: Payer: Self-pay | Admitting: Medical

## 2017-08-28 VITALS — BP 120/70 | HR 64 | Temp 97.6°F | Ht 64.0 in | Wt 121.8 lb

## 2017-08-28 DIAGNOSIS — J029 Acute pharyngitis, unspecified: Secondary | ICD-10-CM | POA: Diagnosis not present

## 2017-08-28 DIAGNOSIS — J988 Other specified respiratory disorders: Secondary | ICD-10-CM

## 2017-08-28 NOTE — Progress Notes (Signed)
Subjective: Chief Complaint  Patient presents with  . Acute Visit    sore throat, started yesterday feeling like something was stuck in throat, not hurting right now   Here for runny nose, left ear discomfort, sore throat, started about a week ago.  Has nasal drainage.   Eyes have been puffy.  Hasn't had allergy problems in the past.  No fever, no body aches, no chills.   Maybe occasional cough.  Felt like cold symptoms last week.  Was taking sudafed with some relief.   Drinks a lot of water.   Yesterday felt like pop corn kernel stuck in throat.  Still having drainage from nose.  No strep throat in the past year.  Gets sore throat often,, maybe 5-6 times per year.  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   Thinks she has gotten sore throats with colds.  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  No other aggravating or relieving factors. No other complaint.  Past Medical History:  Diagnosis Date  . GERD (gastroesophageal reflux disease)   . History of breast lump   . OCD (obsessive compulsive disorder)    Current Outpatient Medications on File Prior to Visit  Medication Sig Dispense Refill  . levonorgestrel-ethinyl estradiol (SRONYX) 0.1-20 MG-MCG tablet Take 1 tablet by mouth daily. 84 tablet 3  . Multiple Vitamins-Minerals (MULTIVITAMIN ADULT PO) Take by mouth.    Marland Kitchen amoxicillin (AMOXIL) 875 MG tablet Take 1 tablet (875 mg total) by mouth 2 (two) times daily. (Patient not taking: Reported on 08/28/2017) 20 tablet 0   No current facility-administered medications on file prior to visit.    ROS as in subjective    Objective: BP 120/70 (BP Location: Right Arm, Patient Position: Sitting, Cuff Size: Normal)   Pulse 64   Temp 97.6 F (36.4 C) (Oral)   Ht 5\' 4"  (1.626 m)   Wt 121 lb 12.8 oz (55.2 kg)   SpO2 98%   BMI 20.91 kg/m   General appearance: Alert, WD/WN, no distress                             Skin: warm, no rash, no diaphoresis                           Head: no  sinus tenderness                            Eyes: conjunctiva normal, corneas clear, PERRLA                            Ears: pearly TMs, external ear canals normal                          Nose: septum midline, turbinates mildly swollen, with erythema and clear discharge             Mouth/throat: MMM, tongue normal, mild pharyngeal erythema, tonsils 1+, small white area of left tonsil likely small tonsillith                           Neck: supple, no adenopathy, no thyromegaly, non tender  Heart: RRR, normal S1, S2, no murmurs                         Lungs: clear rhonchi, no wheezes, no rales                Extremities: no edema, non tender       Assessment; Encounter Diagnoses  Name Primary?  Marland Kitchen Respiratory tract infection Yes  . Sore throat      Plan Discussed symptoms.  discussed tonsillith vs exudate.   currently symptoms suggest URI symptoms that are slowly resolving.     Recommendations:  Use salt water gargles the next few days, 2-3 times daily  Use Ibuprofen OTC, 2 or 3 tablets twice daily for the next 3-5 days  Use hot tea, lemon, honey mixture to drink for throat pain and mucous drainage  Use either sudafed or antihistamine such as benadryl or zyrtec the next 4-5 days  Drink plenty of water  If stuffy in the nose, you can do nasal spray or flush  If not feeling more improvement by Friday, call back  Gina Avila was seen today for acute visit.  Diagnoses and all orders for this visit:  Respiratory tract infection  Sore throat

## 2017-08-28 NOTE — Patient Instructions (Signed)
Recommendations:  Use salt water gargles the next few days, 2-3 times daily  Use Ibuprofen OTC, 2 or 3 tablets twice daily for the next 3-5 days  Use hot tea, lemon, honey mixture to drink for throat pain and mucous drainage  Use either sudafed or antihistamine such as benadryl or zyrtec the next 4-5 days  Drink plenty of water  If stuffy in the nose, you can do nasal spray or flush  If not feeling more improvement by Friday, call back   Using Saline Nose Drops with Bulb Syringe A bulb syringe is used to clear your nose. You may use it when you have a stuffy nose, nasal congestion, sinus pressure, or sneezing.   SALINE SOLUTION You can buy nose drops at your local drug store. You can also make nose drops yourself. Mix 1 cup of water with  teaspoon of salt. Stir. Store this mixture at room temperature. Make a new batch daily.  USE THE BULB IN COMBINATION WITH SALINE NOSE DROPS  Squeeze the air out of the bulb before suctioning the saline mixture.  While still squeezing the bulb flat, place the tip of the bulb into the saline mixture.  Let air come back into the bulb.  This will suction up the saline mixture.  Gently flush one nostril at a time.  Salt water nose drops will then moisten your  congested nose and loosen secretions before suctioning.  Use the bulb syringe as directed below to suction.  USING THE BULB SYRINGE TO SUCTION  While still squeezing the bulb flat, place the tip of the bulb into a nostril. Let air come back into the bulb. The suction will pull snot out of the nose and into the bulb.  Repeat on the other nostril.  Squeeze syringe several times into a tissue.  CLEANING THE BULB SYRINGE  Clean the bulb syringe every day with hot soapy water.  Clean the inside of the bulb by squeezing the bulb while the tip is in soapy water.  Rinse by squeezing the bulb while the tip is in clean hot water.  Store the bulb with the tip side down on paper  towel.  HOME CARE INSTRUCTIONS   Use saline nose drops often to keep the nose open and not stuffy.  Throw away used salt water. Make a new solution every time.  Do not use the same solution and dropper for another person  If you do not prefer to use nasal saline flush, other options include nasal saline spray or the [employer], both of which are available over the counter at your pharmacy.

## 2017-09-12 ENCOUNTER — Telehealth: Payer: Self-pay | Admitting: Obstetrics and Gynecology

## 2017-09-12 NOTE — Telephone Encounter (Signed)
Patient returned call, took UPT, reports as negative. Patient states she has already taken her OCP for today, will start new pack on 09/13/17, will use BUM.   Routing to provider for final review. Patient is agreeable to disposition. Will close encounter.

## 2017-09-12 NOTE — Telephone Encounter (Signed)
The pills won't expire on an exact date. It is very unlikely that pills aren't working. I would recommend that she check a UPT as you suggested and I would just have her start her new pack today. Use a back up method for one week to be very safe.

## 2017-09-12 NOTE — Telephone Encounter (Signed)
Spoke with patient, advised as seen below per Dr. Jertson. Patient verbalizes understanding and is agreeable. Will close encounter.  

## 2017-09-12 NOTE — Telephone Encounter (Signed)
Patient has question regarding medication.

## 2017-09-12 NOTE — Telephone Encounter (Signed)
Spoke with patient. Patient states current pack of OCP and possibly previous pack had expired in 06/2017. Is 1.5 wks into current pack, missed 1 pill earlier in wk, doubled up the following day. SA, does not use BUM.   Reports menses started early, is concerned about taking expired OCP and  missed pill. Reports intermittent dizziness for the last 2-3 wks, felt this was r/t allergies. Denies pain or any other GYN symptoms. Has a new pack of non expired pills, please advise?  Recommended patient take UPT to r/o pregancny, will review with Dr. Talbert Nan and return call with additional recommendations. Patient agreeable.  Dr. Talbert Nan -please advise?

## 2017-10-24 ENCOUNTER — Ambulatory Visit: Payer: BC Managed Care – PPO | Admitting: Family Medicine

## 2017-10-24 ENCOUNTER — Encounter: Payer: Self-pay | Admitting: Family Medicine

## 2017-10-24 VITALS — BP 110/60 | HR 59 | Temp 98.4°F | Resp 16 | Wt 116.8 lb

## 2017-10-24 DIAGNOSIS — R5383 Other fatigue: Secondary | ICD-10-CM | POA: Diagnosis not present

## 2017-10-24 DIAGNOSIS — R6882 Decreased libido: Secondary | ICD-10-CM

## 2017-10-24 NOTE — Progress Notes (Signed)
   Subjective:    Patient ID: Gina Avila, female    DOB: 1990/10/05, 27 y.o.   MRN: [id number]  HPI Chief Complaint  Patient presents with  . energy level    energy level, and low sex drive.- been going on for a couple months   She is here with complaints of a one year history of decreased libido. States she has pain with inercourse. States she has been evaluated by her OB/GYN for this and was advised to use OTC lubricants. States she has not noticed much improvement.  She has underlying OCD and thinks this could be contributing.  Plans to talk with her psychologist about this.  She also complains of unexplained fatigue for the past 1-2 months.   also reports occasional night sweats. Mother with thyroid disease.   She stopped Fluoxetine years ago. She was on it for OCD.   Periods are regular  Denies fever, chills, dizziness, chest pain, shortness of breath, abdominal pain, N/V/D.   Reviewed allergies, medications, past medical, surgical, family, and social history.   Review of Systems Pertinent positives and negatives in the history of present illness.     Objective:   Physical Exam BP 110/60   Pulse (!) 59   Temp 98.4 F (36.9 C) (Oral)   Resp 16   Wt 116 lb 12.8 oz (53 kg)   SpO2 99%   BMI 20.05 kg/m   Alert and in no distress. Tympanic membranes and canals are normal. Pharyngeal area is normal. Neck is supple without adenopathy or thyromegaly. Cardiac exam shows a regular sinus rhythm without murmurs or gallops. Lungs are clear to auscultation.       Assessment & Plan:  Fatigue, unspecified type - Plan: CBC with Differential/Platelet, Comprehensive metabolic panel, TSH, T4, free, VITAMIN D 25 Hydroxy (Vit-D Deficiency, Fractures), Iron, TIBC and Ferritin Panel, Vitamin B12  Decreased libido without sexual dysfunction - Plan: TSH  Discussed multiple etiologies for fatigue. Will check labs.  Discussed that pain with intercourse is most likely affecting her  interest in sex. She may want to try olive oil or coconut oil since OTC lubricants are not helping.  She will see her psychologist as well.  Follow up pending labs

## 2017-10-25 LAB — COMPREHENSIVE METABOLIC PANEL
ALBUMIN: 4.4 g/dL (ref 3.5–5.5)
ALK PHOS: 48 IU/L (ref 39–117)
ALT: 7 IU/L (ref 0–32)
AST: 14 IU/L (ref 0–40)
Albumin/Globulin Ratio: 1.6 (ref 1.2–2.2)
BUN/Creatinine Ratio: 14 (ref 9–23)
BUN: 13 mg/dL (ref 6–20)
Bilirubin Total: 0.3 mg/dL (ref 0.0–1.2)
CALCIUM: 9.6 mg/dL (ref 8.7–10.2)
CO2: 24 mmol/L (ref 20–29)
CREATININE: 0.91 mg/dL (ref 0.57–1.00)
Chloride: 102 mmol/L (ref 96–106)
GFR calc Af Amer: 100 mL/min/{1.73_m2} (ref >59)
GFR, EST NON AFRICAN AMERICAN: 87 mL/min/{1.73_m2} (ref >59)
GLUCOSE: 145 mg/dL — AB (ref 65–99)
Globulin, Total: 2.7 g/dL (ref 1.5–4.5)
Potassium: 4.3 mmol/L (ref 3.5–5.2)
Sodium: 139 mmol/L (ref 134–144)
Total Protein: 7.1 g/dL (ref 6.0–8.5)

## 2017-10-25 LAB — CBC WITH DIFFERENTIAL/PLATELET
BASOS ABS: 0 10*3/uL (ref 0.0–0.2)
Basos: 0 %
EOS (ABSOLUTE): 0.1 10*3/uL (ref 0.0–0.4)
Eos: 1 %
HEMOGLOBIN: 14 g/dL (ref 11.1–15.9)
Hematocrit: 41.6 % (ref 34.0–46.6)
IMMATURE GRANULOCYTES: 0 %
Immature Grans (Abs): 0 10*3/uL (ref 0.0–0.1)
LYMPHS ABS: 2.7 10*3/uL (ref 0.7–3.1)
Lymphs: 28 %
MCH: 30.4 pg (ref 26.6–33.0)
MCHC: 33.7 g/dL (ref 31.5–35.7)
MCV: 90 fL (ref 79–97)
Monocytes Absolute: 0.7 10*3/uL (ref 0.1–0.9)
Monocytes: 7 %
Neutrophils Absolute: 6.4 10*3/uL (ref 1.4–7.0)
Neutrophils: 64 %
Platelets: 175 10*3/uL (ref 150–450)
RBC: 4.6 x10E6/uL (ref 3.77–5.28)
RDW: 13.1 % (ref 12.3–15.4)
WBC: 9.9 10*3/uL (ref 3.4–10.8)

## 2017-10-25 LAB — VITAMIN B12: Vitamin B-12: 541 pg/mL (ref 232–1245)

## 2017-10-25 LAB — IRON,TIBC AND FERRITIN PANEL
Ferritin: 46 ng/mL (ref 15–150)
Iron Saturation: 27 % (ref 15–55)
Iron: 74 ug/dL (ref 27–159)
TIBC: 278 ug/dL (ref 250–450)
UIBC: 204 ug/dL (ref 131–425)

## 2017-10-25 LAB — TSH: TSH: 1.37 u[IU]/mL (ref 0.450–4.500)

## 2017-10-25 LAB — T4, FREE: FREE T4: 1.28 ng/dL (ref 0.82–1.77)

## 2017-10-25 LAB — VITAMIN D 25 HYDROXY (VIT D DEFICIENCY, FRACTURES): Vit D, 25-Hydroxy: 47 ng/mL (ref 30.0–100.0)

## 2017-10-30 LAB — SPECIMEN STATUS REPORT

## 2017-10-30 LAB — HGB A1C W/O EAG: HEMOGLOBIN A1C: 4.9 % (ref 4.8–5.6)

## 2018-01-13 ENCOUNTER — Ambulatory Visit: Payer: BC Managed Care – PPO | Admitting: Family Medicine

## 2018-01-13 ENCOUNTER — Encounter: Payer: Self-pay | Admitting: Family Medicine

## 2018-01-13 VITALS — BP 120/70 | HR 60 | Temp 98.2°F | Wt 112.6 lb

## 2018-01-13 DIAGNOSIS — K219 Gastro-esophageal reflux disease without esophagitis: Secondary | ICD-10-CM

## 2018-01-13 DIAGNOSIS — F458 Other somatoform disorders: Secondary | ICD-10-CM | POA: Diagnosis not present

## 2018-01-13 DIAGNOSIS — H938X2 Other specified disorders of left ear: Secondary | ICD-10-CM

## 2018-01-13 DIAGNOSIS — R110 Nausea: Secondary | ICD-10-CM

## 2018-01-13 DIAGNOSIS — F429 Obsessive-compulsive disorder, unspecified: Secondary | ICD-10-CM | POA: Diagnosis not present

## 2018-01-13 DIAGNOSIS — R0989 Other specified symptoms and signs involving the circulatory and respiratory systems: Secondary | ICD-10-CM

## 2018-01-13 DIAGNOSIS — Z8659 Personal history of other mental and behavioral disorders: Secondary | ICD-10-CM | POA: Insufficient documentation

## 2018-01-13 NOTE — Progress Notes (Signed)
   Subjective:    Patient ID: Gina Avila, female    DOB: 1990-07-23, 27 y.o.   MRN: [id number]  HPI Chief Complaint  Patient presents with  . GI referral    GI referral, having some gallbladder flare up and Acid reflux, wants a referral to Dr. Earlean Shawl   She is here requesting a referral to see GI, Dr. Earlean Shawl.  Complains of feeling like food is getting stuck intermittently. States she has a "globus sensation". She is a Astronomer. History of GERD and is not currently taking any medication for this. States she did not want to take a PPI.   Reports having nausea within 15 minutes after eating. This seems to be getting worse. Worse in the morning.   Reports some weight loss. Denies loss of appetite or eating less than usual. No N/V or abdominal pain.   She saw Dr. Collene Mares in May 2018 and had an abdominal US and hida scan. These were normal.   She is on OCPs.   She also reports fullness in her left ear. No rhinorrhea, nasal congestion or sore throat.   Also reports recent panic attack with history of OCD. She has seen her psychiatrist and therapist for this. Not currently on medication but has taken Prozac in the past. She stopped it for no particular reason. Denies any side effects from this.   Denies fever, chills, dizziness, chest pain, palpitations, shortness of breath,  urinary symptoms, LE edema.   Reviewed allergies, medications, past medical, surgical, family, and social history.   Review of Systems Pertinent positives and negatives in the history of present illness.     Objective:   Physical Exam BP 120/70   Pulse 60   Temp 98.2 F (36.8 C) (Oral)   Wt 112 lb 9.6 oz (51.1 kg)   SpO2 99%   BMI 19.33 kg/m   Alert and in no distress. Tympanic membranes and canals are normal. Pharyngeal area is normal. Neck is supple without adenopathy or thyromegaly. Cardiac exam shows a regular sinus rhythm without murmurs or gallops. Lungs are clear to auscultation. Abdomen is  soft, non distended, hyperactive BS, negative Murphy's sign, negative McBurneys, no palpable masses. No CVAT. Extremities without edema, normal pulses. Skin is warm and dry, no pallor or rash. PERRLA, EOMs intact, CN intact.       Assessment & Plan:  Nausea - Plan: Ambulatory referral to Gastroenterology  Gastroesophageal reflux disease, esophagitis presence not specified - Plan: Ambulatory referral to Gastroenterology  Globus sensation - Plan: Ambulatory referral to Gastroenterology  Obsessive-compulsive disorder, unspecified type  History of panic attacks  Sensation of fullness in left ear  Persistent nausea that was felt to be related to GERD.  Symptoms have been worked up in the past and she was advised to try PPIs. She did not take these for very long. Has not tried Zantac.  Reviewed her recent labs, [place] and hida scan with patient and she is aware that these were normal. She also apparently had an EGD with Dr. Collene Mares. Mild gastritis was found as well as a small hiatal hernia.  Referral to Dr. Earlean Shawl per patient request.  Discussed that her left ear exam is normal. No URI symptoms.  She will see her psychiatrist and therapist for panic attacks and OCD. She is considering starting back on Prozac.  Follow up as needed.

## 2018-02-05 ENCOUNTER — Ambulatory Visit: Payer: BC Managed Care – PPO | Admitting: Family Medicine

## 2018-02-05 ENCOUNTER — Encounter: Payer: Self-pay | Admitting: Family Medicine

## 2018-02-05 VITALS — BP 120/70 | HR 90 | Temp 98.2°F | Resp 16 | Wt 112.8 lb

## 2018-02-05 DIAGNOSIS — K219 Gastro-esophageal reflux disease without esophagitis: Secondary | ICD-10-CM | POA: Diagnosis not present

## 2018-02-05 DIAGNOSIS — Z9109 Other allergy status, other than to drugs and biological substances: Secondary | ICD-10-CM | POA: Diagnosis not present

## 2018-02-05 NOTE — Progress Notes (Signed)
   Subjective:    Patient ID: Gina Avila, female    DOB: 02-Mar-1991, 27 y.o.   MRN: [id number]  HPI Chief Complaint  Patient presents with  . allergic reaction    allergic reaction. went to urgent care and was given shot. doing better now. sees allergist next week  . acid reflux    been taking med that GI gave for acid reflux.delixant made her have some symptoms- sweating, feels like something is stuck in throat    States she had an allergic reaction over the weekend while walking outside. She went to an urgent care and was treated with a steroid injection and benadryl.  No issues since. Unsure what the trigger was. States she had a similar reaction a month ago. She has an allergist appointment next week.  No concerns with this today.   GERD- taking dexlansoprazole 30 mg bid prescribed by her GI Dr. Elyn Peers and states she thinks her reflux is worse. States she has been on this medication one week. Did not take it today.   Takes fluoxetine for anxiety and OCD.  Started on this one month ago. Prescribed by Dr. Albertine Patricia. [facility].   States last night she had hot and cold sweats and had severe reflux. She ate tacos the evening prior.   Denies fever, chills, dizziness, palpitations, shortness of breath, abdominal pain, N/V/D, urinary symptoms, LE edema.   Reviewed allergies, medications, past medical, surgical, family, and social history.   Review of Systems Pertinent positives and negatives in the history of present illness.     Objective:   Physical Exam BP 120/70   Pulse 90   Temp 98.2 F (36.8 C) (Oral)   Resp 16   Wt 112 lb 12.8 oz (51.2 kg)   SpO2 99%   BMI 19.36 kg/m   Alert and in no distress. Pharyngeal area is normal. Neck is supple without adenopathy or thyromegaly. Cardiac exam shows a regular sinus rhythm without murmurs or gallops. Lungs are clear to auscultation.       Assessment & Plan:  Gastroesophageal reflux disease, esophagitis presence not  specified  Environmental allergies  She is well-appearing and exam is unremarkable. Discussed that she can stop the Dexilant for now and see if her symptoms go away.  Counseling on lifestyle modifications for GERD.  She prefers to try Tums or Zantac for now.  Recommend she follow-up with Dr. Earlean Shawl Unknown environmental allergies.  She will see her allergist next week for testing. Follow-up as needed

## 2018-02-05 NOTE — Patient Instructions (Signed)
Try stopping the Dexilant and see how you do.   You could try OTC Zantac 150 mg and see if this helps.  Follow up with Dr. Earlean Shawl if your symptoms are not improving.

## 2018-02-12 ENCOUNTER — Encounter: Payer: Self-pay | Admitting: Allergy and Immunology

## 2018-02-12 ENCOUNTER — Ambulatory Visit: Payer: BC Managed Care – PPO | Admitting: Allergy and Immunology

## 2018-02-12 VITALS — BP 116/70 | HR 78 | Temp 98.2°F | Resp 16 | Ht 64.0 in | Wt 112.0 lb

## 2018-02-12 DIAGNOSIS — F458 Other somatoform disorders: Secondary | ICD-10-CM | POA: Diagnosis not present

## 2018-02-12 DIAGNOSIS — K219 Gastro-esophageal reflux disease without esophagitis: Secondary | ICD-10-CM

## 2018-02-12 DIAGNOSIS — T7840XA Allergy, unspecified, initial encounter: Secondary | ICD-10-CM

## 2018-02-12 MED ORDER — RANITIDINE HCL 300 MG PO CAPS: 300.0000 mg | ORAL_CAPSULE | Freq: Every day | ORAL | 5 refills | Status: DC

## 2018-02-12 NOTE — Progress Notes (Signed)
Dear [place],  Thank you for referring Gina Avila to the [facility] on 02/12/2018.   Below is a summation of this patient's evaluation and recommendations.  Thank you for your referral. I will keep you informed about this patient's response to treatment.   If you have any questions please do not hesitate to contact me.   Sincerely,  Jiles Prows, MD Allergy / Immunology [facility]   ______________________________________________________________________    NEW PATIENT NOTE  Referring Provider: Girtha Rm, NP-C Primary Provider: Girtha Rm, NP-C Date of office visit: 02/12/2018    Subjective:   Chief Complaint:  Gina Avila (DOB: June 17, 1990) is a 28 y.o. female who presents to the clinic on 02/12/2018 with a chief complaint of Shortness of Breath; Numbness (tongue and lips ); and Angioedema .     HPI: Gina Avila presents to this clinic in evaluation of possible allergic reaction.  Apparently about 6 weeks ago she developed an episode of tingling of her tongue and lips in association with some shortness of breath while mowing the grass.  She might of had a transient rash affecting her right arm that was not itchy.  She took a Benadryl and this issue resolved within an hour or so.  About 2 weeks ago while taking a hike in the woods she had a similar episode with tingling of her lips and tingling of her tongue and she felt like her tongue was "expanding".  She took Benadryl and because she had some associated shortness of breath she did go to the urgent care center and was treated with a "shot of steroids".  There may have been a small area affecting her left leg that was itchy but no other associated systemic or constitutional symptoms.  She does not really have an atopic history.  There was no obvious provoking factor giving rise to these episodes.  She was not using any  new medications or had any unusual environmental exposures.  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  In addition, she sometimes gets this "air bubble" stuck in her chest and she has to get it to "pop" to relieve this pressure.  She has seen a GI doctor and has had an upper endoscopy and was given Dexilant 30 mg twice a day which made her regurgitation and burning worse.  She does drink 1 coffee in the morning and has tea about twice a week and does not consume any chocolate.  She has been using a low-fat diet which is helped her stomach significantly.  She also appears to have a issue with anxiety in general and recently started fluoxetine. Her fluoxetine initiation occurred after her initial reaction 6 weeks ago.  Past Medical History:  Diagnosis Date  . GERD (gastroesophageal reflux disease)   . History of breast lump   . OCD (obsessive compulsive disorder)     Past Surgical History:  Procedure Laterality Date  . ESOPHAGOGASTRODUODENOSCOPY     Dr. Collene Mares  . WISDOM TOOTH EXTRACTION      Allergies as of 02/12/2018      Reactions   Codeine Nausea And Vomiting   headaches   Latex Rash      Medication List      Dexlansoprazole 30 MG capsule Take by mouth.   FLUoxetine 10 MG tablet Commonly known as:  PROZAC TAKE 1/2 TO 1  TABLET BY MOUTH EVERY MORNING WITH FOOD   levonorgestrel-ethinyl estradiol 0.1-20 MG-MCG tablet Commonly known as:  AVIANE,ALESSE,LESSINA Take 1 tablet by mouth daily.       Review of systems negative except as noted in HPI / PMHx or noted below:  Review of Systems  Constitutional: Negative.   HENT: Negative.   Eyes: Negative.   Respiratory: Negative.   Cardiovascular: Negative.   Gastrointestinal: Negative.   Genitourinary: Negative.   Musculoskeletal: Negative.   Skin: Positive for rash (Rosacea treated with as needed use of topical agent, possible MetroCream prescribed by dermatologist.).  Neurological:  Negative.   Endo/Heme/Allergies: Negative.   Psychiatric/Behavioral: Negative.     Family History  Problem Relation Age of Onset  . Breast cancer Mother 60  . Thyroid disease Mother   . Hypertension Father   . CAD Father 23  . Thyroid disease Maternal Uncle   . Breast cancer Paternal Aunt   . Thyroid disease Maternal Grandmother   . Osteoporosis Maternal Grandmother   . Stroke Paternal Grandmother 63    Social History   Socioeconomic History  . Marital status: Married    Spouse name: Not on file  . Number of children: Not on file  . Years of education: Not on file  . Highest education level: Not on file  Occupational History  . Not on file  Social Needs  . Financial resource strain: Not on file  . Food insecurity:    Worry: Not on file    Inability: Not on file  . Transportation needs:    Medical: Not on file    Non-medical: Not on file  Tobacco Use  . Smoking status: Never Smoker  . Smokeless tobacco: Never Used  Substance and Sexual Activity  . Alcohol use: Yes    Alcohol/week: 2.0 standard drinks    Types: 2 Standard drinks or equivalent per week  . Drug use: No  . Sexual activity: Yes    Partners: Male    Birth control/protection: Pill, Condom  Lifestyle  . Physical activity:    Days per week: Not on file    Minutes per session: Not on file  . Stress: Not on file  Relationships  . Social connections:    Talks on phone: Not on file    Gets together: Not on file    Attends religious service: Not on file    Active member of club or organization: Not on file    Attends meetings of clubs or organizations: Not on file    Relationship status: Not on file  . Intimate partner violence:    Fear of current or ex partner: Not on file    Emotionally abused: Not on file    Physically abused: Not on file    Forced sexual activity: Not on file  Other Topics Concern  . Not on file  Social History Narrative  . 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  Objective:   Vitals:   02/12/18 0849  BP: 116/70  Pulse: 78  Resp: 16  Temp: 98.2 F (36.8 C)  SpO2: 98%   Height: 5\' 4"  (162.6 cm) Weight: 112 lb (50.8 kg)  Physical Exam  Constitutional:  Slightly raspy voice  HENT:  Head: Normocephalic. Head is  without right periorbital erythema and without left periorbital erythema.  Right Ear: Tympanic membrane, external ear and ear canal normal.  Left Ear: Tympanic membrane, external ear and ear canal normal.  Nose: Nose normal. No mucosal edema or rhinorrhea.  Mouth/Throat: Oropharynx is clear and moist and mucous membranes are normal. No oropharyngeal exudate.  Eyes: Pupils are equal, round, and reactive to light. Conjunctivae and lids are normal.  Neck: Trachea normal. No tracheal deviation present. No thyromegaly present.  Cardiovascular: Normal rate, regular rhythm, S1 normal, S2 normal and normal heart sounds.  No murmur heard. Pulmonary/Chest: Effort normal. No stridor. No respiratory distress. She has no wheezes. She has no rales. She exhibits no tenderness.  Abdominal: Soft. She exhibits no distension and no mass. There is no hepatosplenomegaly. There is no tenderness. There is no rebound and no guarding.  Musculoskeletal: She exhibits no edema or tenderness.  Lymphadenopathy:       Head (right side): No tonsillar adenopathy present.       Head (left side): No tonsillar adenopathy present.    She has no cervical adenopathy.    She has no axillary adenopathy.  Neurological: She is alert.  Skin: Rash (Facial erythema and telangiectasia) noted. She is not diaphoretic. No erythema. No pallor. Nails show no clubbing.    Diagnostics: Allergy skin tests were performed.  She did not demonstrate any hypersensitivity to a  screening panel of aeroallergens or foods.  Assessment and Plan:    1. Allergic reaction, initial encounter   2. Hyperventilation syndrome   3. LPRD (laryngopharyngeal reflux disease)     1.  Allergen avoidance measures?  2.  Slowly taper off caffeine to address reflux and anxiety  3.  Use rebreathing technique during next episode of "allergic reaction"  4.  Use ranitidine 300 mg daily to address reflux  4.  Further evaluation?  Yes, if recurrent reactions   It is not entirely clear whether Gina Avila had a allergic reaction or hyperventilation reaction with her 2 episodes over the course of the past 6 weeks.  At this point we will have her try a rebreathing technique should one of these episodes occur in the future.  If this does not result in resolution of this issue then we may need to have her pursue further evaluation for allergic reaction.  She also appears to have LPR and was intolerant of using a PPI and I made the suggestion that she start ranitidine and consolidate all of her caffeine consumption for this issue.  She will keep in contact with me noting her response to this approach.  Jiles Prows, MD Allergy / Immunology [facility]

## 2018-02-12 NOTE — Patient Instructions (Signed)
  1.  Allergen avoidance measures?  2.  Slowly taper off caffeine to address reflux and anxiety  3.  Use rebreathing technique during next episode of "allergic reaction"  4.  Use ranitidine 300 mg daily to address reflux  4.  Further evaluation?  Yes, if recurrent reactions

## 2018-02-13 ENCOUNTER — Encounter: Payer: Self-pay | Admitting: Allergy and Immunology

## 2018-02-14 NOTE — Addendum Note (Signed)
Addended by: Isabel Caprice on: 02/14/2018 04:07 PM   Modules accepted: Orders

## 2018-03-10 ENCOUNTER — Ambulatory Visit: Payer: BC Managed Care – PPO | Admitting: Allergy and Immunology

## 2018-03-12 ENCOUNTER — Ambulatory Visit: Payer: BC Managed Care – PPO | Admitting: Family Medicine

## 2018-03-21 ENCOUNTER — Encounter: Payer: Self-pay | Admitting: Medical

## 2018-03-21 ENCOUNTER — Ambulatory Visit: Payer: BC Managed Care – PPO | Admitting: Medical

## 2018-03-21 VITALS — BP 130/80 | HR 97 | Temp 98.7°F | Resp 16 | Wt 110.6 lb

## 2018-03-21 DIAGNOSIS — R634 Abnormal weight loss: Secondary | ICD-10-CM | POA: Insufficient documentation

## 2018-03-21 DIAGNOSIS — R251 Tremor, unspecified: Secondary | ICD-10-CM | POA: Insufficient documentation

## 2018-03-21 DIAGNOSIS — R197 Diarrhea, unspecified: Secondary | ICD-10-CM | POA: Diagnosis not present

## 2018-03-21 DIAGNOSIS — R6889 Other general symptoms and signs: Secondary | ICD-10-CM | POA: Insufficient documentation

## 2018-03-21 DIAGNOSIS — G4489 Other headache syndrome: Secondary | ICD-10-CM

## 2018-03-21 NOTE — Progress Notes (Signed)
Subjective: Chief Complaint  Patient presents with  . diarrhea    diarrhea, chills, headache shaking X today   10lb weight loss tires, no appitiete   Here today with her husband.    Today was feeling sick had diarrhea, was shaky, had headaches, cold feeling all over.   Drank a pepsi as she thought blood sugar was low.  After 35-40 minutes was still shaky.  Finally the shakes stopped.   Lost 10lb in the last month.  Been having reduced appetite, getting afternoon headaches.   Been having some changes in vision.   Saw eye doctor and no obvious new problems but seeing therapy for eye tracking issue. Been really sleepy.   Been on Fluoxetine, worried that it was causing symptoms, but spoke to psychiatry today.  They didn't feel these symptoms were due to medication.  Denies substance abuse, no alcohol, no drugs.     Has had some headaches and dizziness for a few weeks. Has been getting this daily about the same time daily.   Past Medical History:  Diagnosis Date  . GERD (gastroesophageal reflux disease)   . History of breast lump   . OCD (obsessive compulsive disorder)    Current Outpatient Medications on File Prior to Visit  Medication Sig Dispense Refill  . FLUoxetine (PROZAC) 10 MG tablet 20 mg.   2  . levonorgestrel-ethinyl estradiol (SRONYX) 0.1-20 MG-MCG tablet Take 1 tablet by mouth daily. 84 tablet 3  . Dexlansoprazole 30 MG capsule Take by mouth.    . ranitidine (ZANTAC) 300 MG capsule Take 1 capsule (300 mg total) by mouth daily. (Patient not taking: Reported on 03/21/2018) 30 capsule 5   No current facility-administered medications on file prior to visit.    ROS as in subjective    Objective: BP 130/80   Pulse 97   Temp 98.7 F (37.1 C) (Oral)   Resp 16   Wt 110 lb 9.6 oz (50.2 kg)   SpO2 99%   BMI 18.98 kg/m     Wt Readings from Last 3 Encounters:  03/21/18 110 lb 9.6 oz (50.2 kg)  02/12/18 112 lb (50.8 kg)  02/05/18 112 lb 12.8 oz (51.2 kg)     General  appearance: alert, no distress, WD/WN, lean white female HEENT: normocephalic, sclerae anicteric, PERRLA, EOMi, nares patent, no discharge or erythema, pharynx normal Oral cavity: MMM, no lesions Neck: supple, no lymphadenopathy, no thyromegaly, no masses Heart: RRR, normal S1, S2, no murmurs Lungs: CTA bilaterally, no wheezes, rhonchi, or rales Abdomen: +bs, soft, non tender, non distended, no masses, no hepatomegaly, no splenomegaly Extremities: no edema, no cyanosis, no clubbing Pulses: 2+ symmetric, upper and lower extremities, normal cap refill Neurological: alert, oriented x 3, CN2-12 intact, strength normal upper extremities and lower extremities, sensation normal throughout, DTRs 2+ throughout, no cerebellar signs, gait normal Psychiatric: normal affect, behavior normal, pleasant     Assessment: Encounter Diagnoses  Name Primary?  . Headache syndrome Yes  . Weight loss   . Shakes   . Diarrhea, unspecified type   . Cold intolerance     Plan: Discussed symptoms, concerns, possible causes.   Labs today.  Advised she use My Fitness Pal to calculate her total daily calories for the past 3 days to get a baseline.  Starting today though begin eating small portions throughout the day, protein + carb every 4 hours to avoid hypoglycemia.    C/t good hydration.   Avoid alcohol and caffeine.  We will call with  lab results  F/u with gyn soon for yearly pap.   She had concerns about weight loss, but per our records she has lost 5 pounds in last month and about 10 lb in past year.   Darene was seen today for diarrhea.  Diagnoses and all orders for this visit:  Headache syndrome -     POCT glucose (manual entry) -     CBC with Differential/Platelet -     TSH -     Basic metabolic panel -     T4, free  Weight loss -     POCT glucose (manual entry) -     CBC with Differential/Platelet -     TSH -     Basic metabolic panel -     T4, free  Shakes -     POCT glucose (manual  entry) -     CBC with Differential/Platelet -     TSH -     Basic metabolic panel -     T4, free  Diarrhea, unspecified type -     POCT glucose (manual entry) -     CBC with Differential/Platelet -     TSH -     Basic metabolic panel -     T4, free  Cold intolerance -     POCT glucose (manual entry) -     CBC with Differential/Platelet -     TSH -     Basic metabolic panel -     T4, free

## 2018-03-22 ENCOUNTER — Other Ambulatory Visit: Payer: Self-pay | Admitting: Obstetrics and Gynecology

## 2018-03-22 LAB — BASIC METABOLIC PANEL
BUN / CREAT RATIO: 13 (ref 9–23)
BUN: 10 mg/dL (ref 6–20)
CALCIUM: 9.9 mg/dL (ref 8.7–10.2)
CO2: 23 mmol/L (ref 20–29)
CREATININE: 0.79 mg/dL (ref 0.57–1.00)
Chloride: 101 mmol/L (ref 96–106)
GFR calc Af Amer: 118 mL/min/{1.73_m2} (ref >59)
GFR calc non Af Amer: 102 mL/min/{1.73_m2} (ref >59)
GLUCOSE: 108 mg/dL — AB (ref 65–99)
Potassium: 4.3 mmol/L (ref 3.5–5.2)
Sodium: 140 mmol/L (ref 134–144)

## 2018-03-22 LAB — CBC WITH DIFFERENTIAL/PLATELET
Basophils Absolute: 0 10*3/uL (ref 0.0–0.2)
Basos: 0 %
EOS (ABSOLUTE): 0 10*3/uL (ref 0.0–0.4)
EOS: 0 %
HEMATOCRIT: 44.8 % (ref 34.0–46.6)
HEMOGLOBIN: 15.1 g/dL (ref 11.1–15.9)
Immature Grans (Abs): 0 10*3/uL (ref 0.0–0.1)
Immature Granulocytes: 0 %
Lymphocytes Absolute: 0.9 10*3/uL (ref 0.7–3.1)
Lymphs: 7 %
MCH: 30 pg (ref 26.6–33.0)
MCHC: 33.7 g/dL (ref 31.5–35.7)
MCV: 89 fL (ref 79–97)
MONOCYTES: 5 %
Monocytes Absolute: 0.6 10*3/uL (ref 0.1–0.9)
NEUTROS ABS: 10.7 10*3/uL — AB (ref 1.4–7.0)
Neutrophils: 88 %
Platelets: 202 10*3/uL (ref 150–450)
RBC: 5.04 x10E6/uL (ref 3.77–5.28)
RDW: 12.6 % (ref 12.3–15.4)
WBC: 12.3 10*3/uL — AB (ref 3.4–10.8)

## 2018-03-22 LAB — TSH: TSH: 1.03 u[IU]/mL (ref 0.450–4.500)

## 2018-03-22 LAB — T4, FREE: FREE T4: 1.27 ng/dL (ref 0.82–1.77)

## 2018-03-24 ENCOUNTER — Ambulatory Visit: Payer: BC Managed Care – PPO | Admitting: Family Medicine

## 2018-03-24 NOTE — Telephone Encounter (Signed)
Medication refill request: OCP Last AEX:  03/25/17 JJ Next AEX: 04/03/18 JJ Last MMG (if hormonal medication request): none Refill authorized: 03/25/17 #[id number]. Today #[id number]/[id number]?

## 2018-03-26 LAB — GLUCOSE, POCT (MANUAL RESULT ENTRY): POC GLUCOSE: 136 mg/dL — AB (ref 70–99)

## 2018-04-01 NOTE — Progress Notes (Addendum)
27 y.o. G0P0000 Married White or Caucasian Not Hispanic or Latino female here for annual exam.   She has seen her primary MD for "shaking", weight loss, loss of appetite. Labs were normal. She was told to eat every 3 hours.  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  She has a h/o anxiety, she went off prozac, went back on which is helping.  She is still having some dryness with intercourse. Helped with coconut oil. Low libido. Able to have orgasms.  She has a h/o bilateral breast fibroadenomas, she used to be able to feel them can't now.   Period Cycle (Days): 28 Period Duration (Days): 7 days Period Pattern: Regular Menstrual Flow: Moderate Menstrual Control: Thin pad Menstrual Control Change Freq (Hours): changes pad every 3 hours Dysmenorrhea: None  Patient's last menstrual period was 03/28/2018.          Sexually active: Yes.    The current method of family planning is OCP (estrogen/progesterone).    Exercising: No.  The patient does not participate in regular exercise at present. Smoker:  no  Health Maintenance: Pap:  03-02-15 WNL  History of abnormal Pap:  no TDaP:  Unsure, thinks due Gardasil: Never, declines.    reports that she has never smoked. She has never used smokeless tobacco. She reports that she drinks about 1.0 standard drinks of alcohol per week. She reports that she does not use drugs. Speech therapist in an elementary school  Past Medical History:  Diagnosis Date  . GERD (gastroesophageal reflux disease)   . History of breast lump   . OCD (obsessive compulsive disorder)     Past Surgical History:  Procedure Laterality Date  . ESOPHAGOGASTRODUODENOSCOPY     Dr. Collene Mares  . WISDOM TOOTH EXTRACTION      Current Outpatient Medications  Medication Sig Dispense Refill  . FLUoxetine (PROZAC) 10 MG tablet 20 mg.   2  . SRONYX 0.1-20 MG-MCG tablet TAKE 1 TABLET BY MOUTH EVERY DAY 1 Package 0  . ranitidine (ZANTAC) 300 MG capsule  Take 1 capsule (300 mg total) by mouth daily. (Patient not taking: Reported on 04/03/2018) 30 capsule 5   No current facility-administered medications for this visit.     Family History  Problem Relation Age of Onset  . Breast cancer Mother 33  . Thyroid disease Mother   . Hypertension Father   . CAD Father 30  . Thyroid disease Maternal Uncle   . Breast cancer Paternal Aunt   . Thyroid disease Maternal Grandmother   . Osteoporosis Maternal Grandmother   . Stroke Paternal Grandmother 54  Mom with breast cancer at 73, triple negative. Mom with negative genetic testing.   PAunt with breast cancer in her 62's.  Review of Systems  Constitutional:       Weight loss  HENT: Negative.   Eyes: Negative.   Respiratory: Negative.   Cardiovascular: Negative.   Gastrointestinal: Negative.   Endocrine: Negative.   Genitourinary:       Loss of sexual interest Bleeding with intercourse  Musculoskeletal: Negative.   Skin: Negative.   Allergic/Immunologic: Negative.   Neurological: Positive for headaches.  Hematological: Bruises/bleeds easily.  Psychiatric/Behavioral: The patient is nervous/anxious.     Exam:   BP 124/84 (BP Location: Right Arm, Patient Position: Sitting, Cuff Size: Normal)   Pulse 64   Ht 5' 5.35" (1.66 m)   Wt 109 lb 9.6 oz (49.7 kg)   LMP 03/28/2018   BMI 18.04  kg/m   Weight change: @WEIGHTCHANGE @ Height:   Height: 5' 5.35" (166 cm)  Ht Readings from Last 3 Encounters:  04/03/18 5' 5.35" (1.66 m)  02/12/18 5\' 4"  (1.626 m)  08/28/17 5\' 4"  (1.626 m)    General appearance: alert, cooperative and appears stated age Head: Normocephalic, without obvious abnormality, atraumatic Neck: no adenopathy, supple, symmetrical, trachea midline and thyroid normal to inspection and palpation Lungs: clear to auscultation bilaterally Cardiovascular: regular rate and rhythm Breasts: slight increased nodularity in the left breast at 12 o'clock in the region of prior  fibroadneoma. The right fibroadenoma is not palpated. No skin changes.  Abdomen: soft, non-tender; non distended,  no masses,  no organomegaly Extremities: extremities normal, atraumatic, no cyanosis or edema Skin: Skin color, texture, turgor normal. No rashes or lesions Lymph nodes: Cervical, supraclavicular, and axillary nodes normal. No abnormal inguinal nodes palpated Neurologic: Grossly normal   Pelvic: External genitalia:  no lesions              Urethra:  normal appearing urethra with no masses, tenderness or lesions              Bartholins and Skenes: normal                 Vagina: normal appearing vagina with normal color and discharge, no lesions              Cervix: no lesions               Bimanual Exam:  Uterus:  normal size, contour, position, consistency, mobility, non-tender              Adnexa: no mass, fullness, tenderness               Rectovaginal: Confirms               Anus:  normal sphincter tone, no lesions  Chaperone was present for exam.  A:  Well Woman with normal exam  H/o bilateral fibroadenomas  Family history of breast cancer  Mild dyspareunia, helped with coconut oil  Low libido, likely multifactorial.   P:   Needs f/u ultrasound for fibroadenomas (right breast 10 o'clock, left breast 12 o'clock)  Pap with hpv  Labs with primary  Discussed breast self exam  Discussed calcium and vit D intake  Continue OCP's  Declines gardasil   Addendum: pap with reflex hpv

## 2018-04-03 ENCOUNTER — Encounter: Payer: Self-pay | Admitting: Obstetrics and Gynecology

## 2018-04-03 ENCOUNTER — Ambulatory Visit: Payer: BC Managed Care – PPO | Admitting: Obstetrics and Gynecology

## 2018-04-03 ENCOUNTER — Other Ambulatory Visit: Payer: Self-pay

## 2018-04-03 VITALS — BP 124/84 | HR 64 | Ht 65.35 in | Wt 109.6 lb

## 2018-04-03 DIAGNOSIS — D241 Benign neoplasm of right breast: Secondary | ICD-10-CM

## 2018-04-03 DIAGNOSIS — Z3041 Encounter for surveillance of contraceptive pills: Secondary | ICD-10-CM

## 2018-04-03 DIAGNOSIS — D242 Benign neoplasm of left breast: Secondary | ICD-10-CM

## 2018-04-03 DIAGNOSIS — Z803 Family history of malignant neoplasm of breast: Secondary | ICD-10-CM | POA: Diagnosis not present

## 2018-04-03 DIAGNOSIS — Z124 Encounter for screening for malignant neoplasm of cervix: Secondary | ICD-10-CM | POA: Diagnosis not present

## 2018-04-03 DIAGNOSIS — Z01419 Encounter for gynecological examination (general) (routine) without abnormal findings: Secondary | ICD-10-CM | POA: Diagnosis not present

## 2018-04-03 MED ORDER — LEVONORGESTREL-ETHINYL ESTRAD 0.1-20 MG-MCG PO TABS: 1.0000 | ORAL_TABLET | Freq: Every day | ORAL | 3 refills | Status: DC

## 2018-04-03 NOTE — Patient Instructions (Signed)
EXERCISE AND DIET:  We recommended that you start or continue a regular exercise program for good health. Regular exercise means any activity that makes your heart beat faster and makes you sweat.  We recommend exercising at least 30 minutes per day at least 3 days a week, preferably 4 or 5.  We also recommend a diet low in fat and sugar.  Inactivity, poor dietary choices and obesity can cause diabetes, heart attack, stroke, and kidney damage, among others.    ALCOHOL AND SMOKING:  Women should limit their alcohol intake to no more than 7 drinks/beers/glasses of wine (combined, not each!) per week. Moderation of alcohol intake to this level decreases your risk of breast cancer and liver damage. And of course, no recreational drugs are part of a healthy lifestyle.  And absolutely no smoking or even second hand smoke. Most people know smoking can cause heart and lung diseases, but did you know it also contributes to weakening of your bones? Aging of your skin?  Yellowing of your teeth and nails?  CALCIUM AND VITAMIN D:  Adequate intake of calcium and Vitamin D are recommended.  The recommendations for exact amounts of these supplements seem to change often, but generally speaking 1,000 mg of calcium (between diet and supplements) and 800 units of Vitamin D per day seems prudent. Certain women may benefit from higher intake of Vitamin D.  If you are among these women, your doctor will have told you during your visit.    PAP SMEARS:  Pap smears, to check for cervical cancer or precancers,  have traditionally been done yearly, although recent scientific advances have shown that most women can have pap smears less often.  However, every woman still should have a physical exam from her gynecologist every 28 year. It will include a breast check, inspection of the vulva and vagina to check for abnormal growths or skin changes, a visual exam of the cervix, and then an exam to evaluate the size and shape of the uterus and  ovaries.  And after 28 years of age, a rectal exam is indicated to check for rectal cancers. We will also provide age appropriate advice regarding health maintenance, like when you should have certain vaccines, screening for sexually transmitted diseases, bone density testing, colonoscopy, mammograms, etc.   MAMMOGRAMS:  All women over 28 years old should have a yearly mammogram. Many facilities now offer a "3D" mammogram, which may cost around $50 extra out of pocket. If possible,  we recommend you accept the option to have the 3D mammogram performed.  It both reduces the number of women who will be called back for extra views which then turn out to be normal, and it is better than the routine mammogram at detecting truly abnormal areas.    COLONOSCOPY:  Colonoscopy to screen for colon cancer is recommended for all women at age 28.  We know, you hate the idea of the prep.  We agree, BUT, having colon cancer and not knowing it is worse!!  Colon cancer so often starts as a polyp that can be seen and removed at colonscopy, which can quite literally save your life!  And if your first colonoscopy is normal and you have no family history of colon cancer, most women don't have to have it again for 10 years.  Once every ten years, you can do something that may end up saving your life, right?  We will be happy to help you get it scheduled when you are ready.  Be sure to check your insurance coverage so you understand how much it will cost.  It may be covered as a preventative service at no cost, but you should check your particular policy.     Breast Self-Awareness Breast self-awareness means being familiar with how your breasts look and feel. It involves checking your breasts regularly and reporting any changes to your health care provider. Practicing breast self-awareness is important. A change in your breasts can be a sign of a serious medical problem. Being familiar with how your breasts look and feel allows  you to find any problems early, when treatment is more likely to be successful. All women should practice breast self-awareness, including women who have had breast implants. How to do a breast self-exam One way to learn what is normal for your breasts and whether your breasts are changing is to do a breast self-exam. To do a breast self-exam: Look for Changes  1. Remove all the clothing above your waist. 2. Stand in front of a mirror in a room with good lighting. 3. Put your hands on your hips. 4. Push your hands firmly downward. 5. Compare your breasts in the mirror. Look for differences between them (asymmetry), such as: ? Differences in shape. ? Differences in size. ? Puckers, dips, and bumps in one breast and not the other. 6. Look at each breast for changes in your skin, such as: ? Redness. ? Scaly areas. 7. Look for changes in your nipples, such as: ? Discharge. ? Bleeding. ? Dimpling. ? Redness. ? A change in position. Feel for Changes  Carefully feel your breasts for lumps and changes. It is best to do this while lying on your back on the floor and again while sitting or standing in the shower or tub with soapy water on your skin. Feel each breast in the following way:  Place the arm on the side of the breast you are examining above your head.  Feel your breast with the other hand.  Start in the nipple area and make  inch (2 cm) overlapping circles to feel your breast. Use the pads of your three middle fingers to do this. Apply light pressure, then medium pressure, then firm pressure. The light pressure will allow you to feel the tissue closest to the skin. The medium pressure will allow you to feel the tissue that is a little deeper. The firm pressure will allow you to feel the tissue close to the ribs.  Continue the overlapping circles, moving downward over the breast until you feel your ribs below your breast.  Move one finger-width toward the center of the body.  Continue to use the  inch (2 cm) overlapping circles to feel your breast as you move slowly up toward your collarbone.  Continue the up and down exam using all three pressures until you reach your armpit.  Write Down What You Find  Write down what is normal for each breast and any changes that you find. Keep a written record with breast changes or normal findings for each breast. By writing this information down, you do not need to depend only on memory for size, tenderness, or location. Write down where you are in your menstrual cycle, if you are still menstruating. If you are having trouble noticing differences in your breasts, do not get discouraged. With time you will become more familiar with the variations in your breasts and more comfortable with the exam. How often should I examine my breasts? Examine your  breasts every month. If you are breastfeeding, the best time to examine your breasts is after a feeding or after using a breast pump. If you menstruate, the best time to examine your breasts is 5-7 days after your period is over. During your period, your breasts are lumpier, and it may be more difficult to notice changes. When should I see my health care provider? See your health care provider if you notice:  A change in shape or size of your breasts or nipples.  A change in the skin of your breast or nipples, such as a reddened or scaly area.  Unusual discharge from your nipples.  A lump or thick area that was not there before.  Pain in your breasts.  Anything that concerns you.  This information is not intended to replace advice given to you by your health care provider. Make sure you discuss any questions you have with your health care provider. Document Released: 05/21/2005 Document Revised: 10/27/2015 Document Reviewed: 04/10/2015 Elsevier Interactive Patient Education  [employer].

## 2018-04-07 ENCOUNTER — Other Ambulatory Visit (HOSPITAL_COMMUNITY)
Admission: RE | Admit: 2018-04-07 | Discharge: 2018-04-07 | Disposition: A | Discharge status: Home / Self Care | Payer: BC Managed Care – PPO | Source: Ambulatory Visit | Attending: Obstetrics and Gynecology | Admitting: Obstetrics and Gynecology

## 2018-04-07 DIAGNOSIS — Z124 Encounter for screening for malignant neoplasm of cervix: Secondary | ICD-10-CM | POA: Diagnosis not present

## 2018-04-07 LAB — HM PAP SMEAR: HM Pap smear: NEGATIVE

## 2018-04-07 NOTE — Addendum Note (Signed)
Addended by: Dorothy Spark on: 04/07/2018 06:04 PM   Modules accepted: Orders

## 2018-04-08 ENCOUNTER — Telehealth: Payer: Self-pay

## 2018-04-08 DIAGNOSIS — D241 Benign neoplasm of right breast: Secondary | ICD-10-CM

## 2018-04-08 DIAGNOSIS — D242 Benign neoplasm of left breast: Principal | ICD-10-CM

## 2018-04-08 NOTE — Telephone Encounter (Signed)
Spoke with the [facility]. Bilateral breast ultrasounds scheduled for 04/23/2018 at 1:40 pm. Spoke with patient who is agreeable to date and time. Declines earlier date as she needs a late afternoon appointment. Patient placed in mammogram hold.  Routing to provider and will close encounter.

## 2018-04-09 LAB — CYTOLOGY - PAP: Diagnosis: NEGATIVE

## 2018-04-23 ENCOUNTER — Other Ambulatory Visit: Payer: BC Managed Care – PPO

## 2018-04-23 ENCOUNTER — Inpatient Hospital Stay: Admission: RE | Admit: 2018-04-23 | Payer: BC Managed Care – PPO | Source: Ambulatory Visit

## 2018-05-04 HISTORY — PX: CERVICAL DISC SURGERY: SHX588

## 2018-05-13 ENCOUNTER — Other Ambulatory Visit: Payer: BC Managed Care – PPO

## 2018-05-14 ENCOUNTER — Ambulatory Visit
Admission: RE | Admit: 2018-05-14 | Discharge: 2018-05-14 | Disposition: A | Discharge status: Home / Self Care | Payer: BC Managed Care – PPO | Source: Ambulatory Visit | Attending: Obstetrics and Gynecology | Admitting: Obstetrics and Gynecology

## 2018-05-14 DIAGNOSIS — D241 Benign neoplasm of right breast: Secondary | ICD-10-CM

## 2018-05-14 DIAGNOSIS — D242 Benign neoplasm of left breast: Principal | ICD-10-CM

## 2018-07-22 ENCOUNTER — Encounter: Payer: Self-pay | Admitting: Family Medicine

## 2018-07-22 ENCOUNTER — Ambulatory Visit: Payer: BC Managed Care – PPO | Admitting: Family Medicine

## 2018-07-22 VITALS — BP 124/82 | HR 80 | Temp 99.5°F | Wt 116.8 lb

## 2018-07-22 DIAGNOSIS — H0015 Chalazion left lower eyelid: Secondary | ICD-10-CM

## 2018-07-22 MED ORDER — ERYTHROMYCIN 5 MG/GM OP OINT: 1.0000 "application " | TOPICAL_OINTMENT | Freq: Three times a day (TID) | OPHTHALMIC | 0 refills | Status: DC

## 2018-07-22 NOTE — Progress Notes (Signed)
   Subjective:    Patient ID: Gina Avila, female    DOB: 09/19/1990, 28 y.o.   MRN: [id number]  HPI She complains of some swelling and minimal discomfort to the medial left lower eyelid.   Review of Systems     Objective:   Physical Exam Cornea and conjunctive are normal.  chalazion noted in the medial aspect of the lower lid.       Assessment & Plan:  Chalazion of left lower eyelid - Plan: erythromycin ophthalmic ointment I will give her erythromycin ointment and also use heat to the area.  If she continues have difficulty over the next week, I will then refer to ophthalmology.

## 2018-10-28 ENCOUNTER — Ambulatory Visit: Payer: BC Managed Care – PPO | Admitting: Family Medicine

## 2018-10-28 ENCOUNTER — Encounter: Payer: Self-pay | Admitting: Family Medicine

## 2018-10-28 ENCOUNTER — Other Ambulatory Visit: Payer: Self-pay

## 2018-10-28 VITALS — Temp 96.8°F | Wt 119.0 lb

## 2018-10-28 DIAGNOSIS — R233 Spontaneous ecchymoses: Secondary | ICD-10-CM

## 2018-10-28 DIAGNOSIS — R61 Generalized hyperhidrosis: Secondary | ICD-10-CM

## 2018-10-28 NOTE — Progress Notes (Signed)
   Subjective:   Documentation for virtual audio and video telecommunications through [place] encounter:  The patient was located at home. 2 patient identifiers used.  The provider was located in the office. The patient did consent to this visit and is aware of possible charges through their insurance for this visit.  The other persons participating in this telemedicine service were none.     Patient ID: Gina Avila, female    DOB: [birth year], 28 y.o.   MRN: [id number]  HPI Chief Complaint  Patient presents with  . bruising on left leg    bruising on left leg, happen back in april and again last week. has not hit it on anything and does not hurt . right buttcheck- has a dent in it and never been there before, not sure if its relate. has a family history of thyroid    Complains of a multiple bruises on her left lower extremity. First noticed this on September 15, 2018. She is showing me a bruise to her left medial knee and shin. Denies injury. States the bruises are unexplained. States the areas are not tender.   No skin, hair or nail changes or other areas of bruising.  No new medications.  No bleeding  Reports having intermittent night sweats for the past 2 months.   Stopped birth control pills last month. Has not started having her periods again as of now.   Denies fever, chills, dizziness, chest pain, palpitations, shortness of breath, abdominal pain, N/V/D, urinary symptoms, LE edema. Denies changes to bowel habits.   Uncle with DVT and PE recently. No other bleeding disorders in family.   Denies fever, chills, dizziness, chest pain, palpitations, shortness of breath, abdominal pain, N/V/D, urinary symptoms, LE edema.   Reviewed allergies, medications, past medical, surgical, family, and social history.   Review of Systems Pertinent positives and negatives in the history of present illness.     Objective:   Physical Exam Temp (!) 96.8 F (36 C) (Oral)   Wt 119 lb (54  kg)   LMP 10/02/2018   BMI 19.59 kg/m   Alert and oriented and in no acute distress.  Purplish bruise to medial aspect of the left knee as well as a small bruise that appears to be healing to medial aspect of left lower leg.  Unable to perform a physical exam due to this being a virtual visit.      Assessment & Plan:  Bruising, spontaneous - Plan: CBC with Differential/Platelet, Comprehensive metabolic panel, Protime-INR  Night sweats - Plan: CBC with Differential/Platelet, Comprehensive metabolic panel, TSH, T4, free  Discussed limitations of virtual visit. She does not appear to be in any acute distress. She repeatedly denies injury to her left leg and reports the bruising is unexplained. No obvious bleeding. Will check labs and urine and follow-up.  Time spent on call was 15 minutes and in review of previous records 2 minutes total.  This virtual service is not related to other E/M service within previous 7 days.

## 2018-10-29 ENCOUNTER — Other Ambulatory Visit: Payer: Self-pay | Admitting: Family Medicine

## 2018-10-29 ENCOUNTER — Other Ambulatory Visit (INDEPENDENT_AMBULATORY_CARE_PROVIDER_SITE_OTHER): Payer: BC Managed Care – PPO

## 2018-10-29 ENCOUNTER — Telehealth: Payer: Self-pay | Admitting: Internal Medicine

## 2018-10-29 VITALS — BP 110/70 | HR 66 | Temp 98.4°F | Ht 65.0 in | Wt 116.2 lb

## 2018-10-29 DIAGNOSIS — R3129 Other microscopic hematuria: Secondary | ICD-10-CM

## 2018-10-29 DIAGNOSIS — R61 Generalized hyperhidrosis: Secondary | ICD-10-CM

## 2018-10-29 DIAGNOSIS — R233 Spontaneous ecchymoses: Secondary | ICD-10-CM

## 2018-10-29 LAB — POCT URINALYSIS DIP (PROADVANTAGE DEVICE)
Bilirubin, UA: NEGATIVE
Glucose, UA: NEGATIVE mg/dL
Ketones, POC UA: NEGATIVE mg/dL
Leukocytes, UA: NEGATIVE
Nitrite, UA: NEGATIVE
Protein Ur, POC: NEGATIVE mg/dL
Specific Gravity, Urine: 1.01
Urobilinogen, Ur: NEGATIVE
pH, UA: 7 (ref 5.0–8.0)

## 2018-10-29 NOTE — Telephone Encounter (Signed)
done

## 2018-10-29 NOTE — Telephone Encounter (Signed)
Urine order is now in.

## 2018-10-29 NOTE — Telephone Encounter (Signed)
Pt was here for labs and urine but no urine order was placed

## 2018-10-30 LAB — CBC WITH DIFFERENTIAL/PLATELET
Basophils Absolute: 0 10*3/uL (ref 0.0–0.2)
Basos: 1 %
EOS (ABSOLUTE): 0.1 10*3/uL (ref 0.0–0.4)
Eos: 1 %
Hematocrit: 43.6 % (ref 34.0–46.6)
Hemoglobin: 14.9 g/dL (ref 11.1–15.9)
Immature Grans (Abs): 0 10*3/uL (ref 0.0–0.1)
Immature Granulocytes: 0 %
Lymphocytes Absolute: 1.5 10*3/uL (ref 0.7–3.1)
Lymphs: 22 %
MCH: 30.7 pg (ref 26.6–33.0)
MCHC: 34.2 g/dL (ref 31.5–35.7)
MCV: 90 fL (ref 79–97)
Monocytes Absolute: 0.5 10*3/uL (ref 0.1–0.9)
Monocytes: 7 %
Neutrophils Absolute: 4.8 10*3/uL (ref 1.4–7.0)
Neutrophils: 69 %
Platelets: 156 10*3/uL (ref 150–450)
RBC: 4.85 x10E6/uL (ref 3.77–5.28)
RDW: 12.2 % (ref 11.7–15.4)
WBC: 6.9 10*3/uL (ref 3.4–10.8)

## 2018-10-30 LAB — COMPREHENSIVE METABOLIC PANEL
ALT: 11 IU/L (ref 0–32)
AST: 20 IU/L (ref 0–40)
Albumin/Globulin Ratio: 2 (ref 1.2–2.2)
Albumin: 4.7 g/dL (ref 3.9–5.0)
Alkaline Phosphatase: 56 IU/L (ref 39–117)
BUN/Creatinine Ratio: 9 (ref 9–23)
BUN: 9 mg/dL (ref 6–20)
Bilirubin Total: 0.4 mg/dL (ref 0.0–1.2)
CO2: 25 mmol/L (ref 20–29)
Calcium: 9.8 mg/dL (ref 8.7–10.2)
Chloride: 101 mmol/L (ref 96–106)
Creatinine, Ser: 0.96 mg/dL (ref 0.57–1.00)
GFR calc Af Amer: 93 mL/min/{1.73_m2} (ref >59)
GFR calc non Af Amer: 81 mL/min/{1.73_m2} (ref >59)
Globulin, Total: 2.4 g/dL (ref 1.5–4.5)
Glucose: 92 mg/dL (ref 65–99)
Potassium: 4.6 mmol/L (ref 3.5–5.2)
Sodium: 140 mmol/L (ref 134–144)
Total Protein: 7.1 g/dL (ref 6.0–8.5)

## 2018-10-30 LAB — URINALYSIS, MICROSCOPIC ONLY
Bacteria, UA: NONE SEEN
Casts: NONE SEEN /lpf

## 2018-10-30 LAB — PROTIME-INR
INR: 1 (ref 0.8–1.2)
Prothrombin Time: 10.9 s (ref 9.1–12.0)

## 2018-10-30 LAB — TSH: TSH: 1.17 u[IU]/mL (ref 0.450–4.500)

## 2018-10-30 LAB — T4, FREE: Free T4: 1.07 ng/dL (ref 0.82–1.77)

## 2019-03-14 ENCOUNTER — Other Ambulatory Visit: Payer: Self-pay | Admitting: Obstetrics and Gynecology

## 2019-04-02 ENCOUNTER — Other Ambulatory Visit: Payer: Self-pay

## 2019-04-02 DIAGNOSIS — Z20822 Contact with and (suspected) exposure to covid-19: Secondary | ICD-10-CM

## 2019-04-04 LAB — NOVEL CORONAVIRUS, NAA: SARS-CoV-2, NAA: NOT DETECTED

## 2019-04-09 ENCOUNTER — Ambulatory Visit: Payer: BC Managed Care – PPO | Admitting: Obstetrics and Gynecology

## 2019-04-27 ENCOUNTER — Other Ambulatory Visit: Payer: Self-pay

## 2019-04-27 ENCOUNTER — Encounter: Payer: Self-pay | Admitting: Medical

## 2019-04-27 ENCOUNTER — Ambulatory Visit (INDEPENDENT_AMBULATORY_CARE_PROVIDER_SITE_OTHER): Payer: BC Managed Care – PPO | Admitting: Medical

## 2019-04-27 VITALS — BP 100/70 | HR 70 | Temp 96.8°F | Ht 62.0 in | Wt 123.2 lb

## 2019-04-27 DIAGNOSIS — I889 Nonspecific lymphadenitis, unspecified: Secondary | ICD-10-CM | POA: Diagnosis not present

## 2019-04-27 NOTE — Progress Notes (Signed)
Subjective:  Gina Avila is a 28 y.o. female who presents for Chief Complaint  Patient presents with  . Mass    ON BACK      Here for possible swollen lymph node.  She notes a few day history of swollen lymph node in the right neck and left neck.  Otherwise no symptoms.  No fever no cough no respiratory symptoms no recent night sweats no weight loss.  Appetite is fine.  No nausea no vomiting no belly pain.  No other symptoms.  Been in usual state of health without complaint.  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  No other aggravating or relieving factors. No other complaint.  The following portions of the patient's history were reviewed and updated as appropriate: allergies, current medications, past family history, past medical history, past social history, past surgical history and problem list.  ROS Otherwise as in subjective above  Objective: BP 100/70   Pulse 70   Temp (!) 96.8 F (36 C)   Ht 5\' 2"  (1.575 m)   Wt 123 lb 3.2 oz (55.9 kg)   SpO2 96%   BMI 22.53 kg/m   General appearance: alert, no distress, well developed, well nourished HEENT: normocephalic, sclerae anicteric, conjunctiva pink and moist, TMs pearly, nares patent, no discharge or erythema, pharynx normal Neck: supple, few small shotty mildly tender less than 0.5 cm diameter lymph nodes palpated in the submandibular anterior and posterior cervical region bilaterally, no worrisome enlarged lymph nodes, no thyromegaly, no masses Heart: RRR, normal S1, S2, no murmurs Lungs: CTA bilaterally, no wheezes, rhonchi, or rales   Assessment: Encounter Diagnosis  Name Primary?  . Lymphadenitis Yes     Plan: Discussed her symptoms, findings consistent with mild lymphadenitis.  Reassured.  We discussed body's common immune response.  She has no other symptoms currently, no recent red flag symptoms such as  fevers night sweats or weight loss.  At this point advised over-the-counter NSAID for the next several days, use a watch and wait approach.  No reason for alarm at this time.  I reviewed labs back from May 2020 that were normal for electrolytes, blood counts, thyroid.  Follow-up as needed  Hazelee was seen today for mass.  Diagnoses and all orders for this visit:  Lymphadenitis    Follow up: prn

## 2019-06-01 ENCOUNTER — Ambulatory Visit: Payer: BC Managed Care – PPO | Attending: Internal Medicine

## 2019-06-01 DIAGNOSIS — Z20822 Contact with and (suspected) exposure to covid-19: Secondary | ICD-10-CM

## 2019-06-03 LAB — NOVEL CORONAVIRUS, NAA: SARS-CoV-2, NAA: NOT DETECTED

## 2019-06-05 NOTE — L&D Delivery Note (Signed)
DELIVERY NOTE  Pitocin never started as patient made cervical change and continued to contract q1-91m after AROM. Pt complete and at +2 station with urge to push. Epidural controlling pain. Pt pushed and delivered a viable female infant in ROA position. Anterior and posterior shoulders spontaneously delivered with next two pushes; body easily followed next. Infant placed on mothers abdomen and bulb suction of mouth and nose performed. Cord was then clamped and cut by MD. Cord blood obtained, 3VC. Baby had a vigorous spontaneous cry noted. Placenta then delivered at 1207 intact. Fundal massage performed and pitocin per protocol. Fundus firm. The following lacerations were noted: left sulcal, 2nd degree. Repaired in routine fashion with 2-0 vicryl rapide. Mother and baby stable. Counts correct   Infant time: 1203 Gender: female, desires circ Placenta time: 1207 Apgars: 8/9 EBL: 450cc Weight: pending skin-to-skin

## 2019-10-19 ENCOUNTER — Other Ambulatory Visit: Payer: Self-pay

## 2019-10-19 ENCOUNTER — Ambulatory Visit: Payer: BC Managed Care – PPO | Admitting: Internal Medicine

## 2019-10-19 ENCOUNTER — Encounter: Payer: Self-pay | Admitting: Internal Medicine

## 2019-10-19 VITALS — BP 122/70 | HR 88 | Ht 62.0 in | Wt 116.0 lb

## 2019-10-19 DIAGNOSIS — E0590 Thyrotoxicosis, unspecified without thyrotoxic crisis or storm: Secondary | ICD-10-CM | POA: Diagnosis not present

## 2019-10-19 LAB — T4, FREE: Free T4: 1.01 ng/dL (ref 0.60–1.60)

## 2019-10-19 LAB — T3, FREE: T3, Free: 2.9 pg/mL (ref 2.3–4.2)

## 2019-10-19 LAB — TSH: TSH: 0.02 u[IU]/mL — ABNORMAL LOW (ref 0.35–4.50)

## 2019-10-19 NOTE — Patient Instructions (Signed)
You may have Gestational Transient Thyrotoxicosis.  Please stop at the lab.  Please come back for a follow-up appointment in 4 months.   Hyperthyroidism  Hyperthyroidism is when the thyroid gland is too active (overactive). The thyroid gland is a small gland located in the lower front part of the neck, just in front of the windpipe (trachea). This gland makes hormones that help control how the body uses food for energy (metabolism) as well as how the heart and brain function. These hormones also play a role in keeping your bones strong. When the thyroid is overactive, it produces too much of a hormone called thyroxine. What are the causes? This condition may be caused by:  Graves' disease. This is a disorder in which the body's disease-fighting system (immune system) attacks the thyroid gland. This is the most common cause.  Inflammation of the thyroid gland.  A tumor in the thyroid gland.  Use of certain medicines, including: ? Prescription thyroid hormone replacement. ? Herbal supplements that mimic thyroid hormones. ? Amiodarone therapy.  Solid or fluid-filled lumps within your thyroid gland (thyroid nodules).  Taking in a large amount of iodine from foods or medicines. What increases the risk? You are more likely to develop this condition if:  You are female.  You have a family history of thyroid conditions.  You smoke tobacco.  You use a medicine called lithium.  You take medicines that affect the immune system (immunosuppressants). What are the signs or symptoms? Symptoms of this condition include:  Nervousness.  Inability to tolerate heat.  Unexplained weight loss.  Diarrhea.  Change in the texture of hair or skin.  Heart skipping beats or making extra beats.  Rapid heart rate.  Loss of menstruation.  Shaky hands.  Fatigue.  Restlessness.  Sleep problems.  Enlarged thyroid gland or a lump in the thyroid (nodule). You may also have symptoms of  Graves' disease, which may include:  Protruding eyes.  Dry eyes.  Red or swollen eyes.  Problems with vision. How is this diagnosed? This condition may be diagnosed based on:  Your symptoms and medical history.  A physical exam.  Blood tests.  Thyroid ultrasound. This test involves using sound waves to produce images of the thyroid gland.  A thyroid scan. A radioactive substance is injected into a vein, and images show how much iodine is present in the thyroid.  Radioactive iodine uptake test (RAIU). A small amount of radioactive iodine is given by mouth to see how much iodine the thyroid absorbs after a certain amount of time. How is this treated? Treatment depends on the cause and severity of the condition. Treatment may include:  Medicines to reduce the amount of thyroid hormone your body makes.  Radioactive iodine treatment (radioiodine therapy). This involves swallowing a small dose of radioactive iodine, in capsule or liquid form, to kill thyroid cells.  Surgery to remove part or all of your thyroid gland. You may need to take thyroid hormone replacement medicine for the rest of your life after thyroid surgery.  Medicines to help manage your symptoms. Follow these instructions at home:   Take over-the-counter and prescription medicines only as told by your health care provider.  Do not use any products that contain nicotine or tobacco, such as cigarettes and e-cigarettes. If you need help quitting, ask your health care provider.  Follow any instructions from your health care provider about diet. You may be instructed to limit foods that contain iodine.  Keep all follow-up visits as  told by your health care provider. This is important. ? You will need to have blood tests regularly so that your health care provider can monitor your condition. Contact a health care provider if:  Your symptoms do not get better with treatment.  You have a fever.  You are taking  thyroid hormone replacement medicine and you: ? Have symptoms of depression. ? Feel like you are tired all the time. ? Gain weight. Get help right away if:  You have chest pain.  You have decreased alertness or a change in your awareness.  You have abdominal pain.  You feel dizzy.  You have a rapid heartbeat.  You have an irregular heartbeat.  You have difficulty breathing. Summary  The thyroid gland is a small gland located in the lower front part of the neck, just in front of the windpipe (trachea).  Hyperthyroidism is when the thyroid gland is too active (overactive) and produces too much of a hormone called thyroxine.  The most common cause is Graves' disease, a disorder in which your immune system attacks the thyroid gland.  Hyperthyroidism can cause various symptoms, such as unexplained weight loss, nervousness, inability to tolerate heat, or changes in your heartbeat.  Treatment may include medicine to reduce the amount of thyroid hormone your body makes, radioiodine therapy, surgery, or medicines to manage symptoms. This information is not intended to replace advice given to you by your health care provider. Make sure you discuss any questions you have with your health care provider. Document Revised: 05/03/2017 Document Reviewed: 05/01/2017 Elsevier Patient Education  2020 [employer].

## 2019-10-19 NOTE — Progress Notes (Signed)
Patient ID: Gina Avila, female   DOB: 1991/05/31, 29 y.o.   MRN: [id number]   This visit occurred during the SARS-CoV-2 public health emergency.  Safety protocols were in place, including screening questions prior to the visit, additional usage of staff PPE, and extensive cleaning of exam room while observing appropriate contact time as indicated for disinfecting solutions.   HPI  Gina Avila is a 29 y.o.-year-old female, referred by her OB/GYN doctor, Dr. Sandford Craze, for evaluation and management of thyrotoxicosis during pregnancy.  She is [redacted] weeks pregnant.  This is her first pregnancy.  She does not have a history of thyroid disease but was recently found to have a low TSH during general labs performed in the first trimester of pregnancy.  I reviewed pt's thyroid tests: 10/13/2019: TSH 0.027 (0.45-4.5), free T4 1.62 (0.82-1.77) 01/2019: TSH 0.6 Lab Results  Component Value Date   TSH 1.170 10/29/2018   TSH 1.030 03/21/2018   TSH 1.370 10/24/2017   TSH 1.12 04/02/2017   TSH 0.86 03/21/2016   FREET4 1.07 10/29/2018   FREET4 1.27 03/21/2018   FREET4 1.28 10/24/2017   Antithyroid antibodies: No results found for: TSI  Pt denies: - feeling nodules in neck - hoarseness - dysphagia - choking - SOB with lying down She does cough after she eats. She has GERD. She saw GI >> had EGD  - hiatal hernia. Tums help.  She mentions: - + weight loss (2 lbs) but also weight gain in the setting of pregnancy - initially had food aversion, now starting to tolerate food better - + fatigue - no excessive sweating/heat intolerance, + cold intolerance - no tremors, only when hungry - chronic - + chronic anxiety - + palpitations before the pregnancy >> resolved - no hyperdefecation - no hair loss  Pt has FH of thyroid ds.  In mother, maternal grandmother and maternal uncle.  No FH of thyroid cancer. No h/o radiation tx to head or neck.  No seaweed or kelp, no recent contrast studies.  No steroid use. No herbal supplements. No Biotin use except in MVI.  Pt. also has a history of cervical spine sxs.  ROS: Constitutional: + see HPI Eyes: no blurry vision, no xerophthalmia ENT: no sore throat, + see HPI Cardiovascular: no CP/SOB/palpitations/leg swelling Respiratory: + Cough/no SOB Gastrointestinal: no N/V/D/C/+ acid reflux Musculoskeletal: no muscle/joint aches Skin: no rashes, prev. Easy bruising  - resolved Neurological: + tremors only when hungry/no numbness/tingling/dizziness, + HA Psychiatric: no depression/+ anxiety  Past Medical History:  Diagnosis Date  . GERD (gastroesophageal reflux disease)   . History of breast lump   . OCD (obsessive compulsive disorder)    Past Surgical History:  Procedure Laterality Date  . ESOPHAGOGASTRODUODENOSCOPY     Dr. Collene Mares  . WISDOM TOOTH EXTRACTION     Social History   Socioeconomic History  . Marital status: Married    Spouse name: Not on file  . Number of children: Not on file  . Years of education: Not on file  . Highest education level: Not on file  Occupational History  . Not on file  Tobacco Use  . Smoking status: Never Smoker  . Smokeless tobacco: Never Used  Substance and Sexual Activity  . Alcohol use: Yes    Alcohol/week: 1.0 standard drinks    Types: 1 Standard drinks or equivalent per week  . Drug use: No  . Sexual activity: Yes    Partners: Male    Birth control/protection: Pill  Other Topics Concern  .  Not on file  Social History Narrative  . Not on file   Social Determinants of Health   Financial Resource Strain:   . Difficulty of Paying Living Expenses:   Food Insecurity:   . Worried About Charity fundraiser in the Last Year:   . Arboriculturist in the Last Year:   Transportation Needs:   . Film/video editor (Medical):   Marland Kitchen Lack of Transportation (Non-Medical):   Physical Activity:   . Days of Exercise per Week:   . Minutes of Exercise per Session:   Stress:   . Feeling of  Stress :   Social Connections:   . Frequency of Communication with Friends and Family:   . Frequency of Social Gatherings with Friends and Family:   . Attends Religious Services:   . Active Member of Clubs or Organizations:   . Attends Archivist Meetings:   Marland Kitchen Marital Status:   Intimate Partner Violence:   . Fear of Current or Ex-Partner:   . Emotionally Abused:   Marland Kitchen Physically Abused:   . Sexually Abused:    Current Outpatient Medications on File Prior to Visit  Medication Sig Dispense Refill  . Prenatal Vit-Fe Fumarate-FA (PRENATAL VITAMIN PO) Take by mouth.     No current facility-administered medications on file prior to visit.   Allergies  Allergen Reactions  . Codeine Nausea And Vomiting    headaches  . Latex Rash   Family History  Problem Relation Age of Onset  . Breast cancer Mother 36  . Thyroid disease Mother   . Hypertension Father   . CAD Father 61  . Thyroid disease Maternal Uncle   . Breast cancer Paternal Aunt   . Thyroid disease Maternal Grandmother   . Osteoporosis Maternal Grandmother   . Stroke Paternal Grandmother 22    PE: BP 122/70   Pulse 88   Ht 5\' 2"  (1.575 m)   Wt 116 lb (52.6 kg)   SpO2 99%   BMI 21.22 kg/m  Wt Readings from Last 3 Encounters:  10/19/19 116 lb (52.6 kg)  04/27/19 123 lb 3.2 oz (55.9 kg)  10/29/18 116 lb 3.2 oz (52.7 kg)   Constitutional: normal weight, in NAD Eyes: PERRLA, EOMI, no exophthalmos, no lid lag, no stare ENT: moist mucous membranes, + lumpy-bumpy thyromegaly, no thyroid bruits, no cervical lymphadenopathy Cardiovascular: RRR, No MRG Respiratory: CTA B Gastrointestinal: abdomen soft, NT, ND, BS+ Musculoskeletal: no deformities, strength intact in all 4 Skin: moist, warm, no rashes Neurological: no tremor with outstretched hands, DTR normal in all 4  ASSESSMENT: 1. Thyrotoxicosis  PLAN:  1. Patient with a recently found low TSH, with thyrotoxic sxs: weight loss, heat intolerance,  hyperdefecation, palpitations, anxiety.  - she does not appear to have exogenous causes for the low TSH.  - We discussed that possible causes of thyrotoxicosis are:  Marland Kitchen Gestational transient thyrotoxicosis -I explained that this can occur at the beginning of pregnancy and is related to the very high beta hCG level, which can cross-act on the TSH receptors and stimulate thyroid hormone production.  This improves as the pregnancy progresses.  Usually, no treatment is needed, unless patient is symptomatic. Berenice Primas ds  . Thyroiditis . toxic multinodular goiter/ toxic adenoma - will check the TSH, fT3 and fT4 and also add thyroid stimulating antibodies to screen for Graves' disease.  - If the tests remain abnormal, but improving, we only need to follow her with repeated TFTs most likely in  approximately 2 months.  If the test worsen, she may need treatment, symptomatic, with beta-blockers, or PTU (explained that this is preferred to methimazole when the first trimester pregnancy).  However, explained that these are not without possible side effects and we should avoid them especially since she is not clearly symptomatic with thyrotoxicosis.  She had some nausea, which starts to improve.  She also had some weight loss, most likely due to not being able to eat. - we discussed about possible modalities of treatment for the above conditions, to include methimazole use, radioactive iodine ablation or (last resort) surgery.  Of course, RAI treatment is not performed during pregnancy and surgery should be avoided, if possible. - we may need to do thyroid ultrasound after the pregnancy to investigate her thyromegaly - I do not feel that we need to add beta blockers at this time, since she is not tachycardic or tremulous - no signs of Graves' ophthalmopathy: she does not have any double vision, blurry vision, eye pain, chemosis. - RTC in 4 months, but likely sooner for repeat labs  Component     Latest Ref Rng &  Units 10/19/2019  TSH     0.35 - 4.50 uIU/mL 0.02 (L)  T4,Free(Direct)     0.60 - 1.60 ng/dL 1.01  Triiodothyronine,Free,Serum     2.3 - 4.2 pg/mL 2.9  TSI     <140 % baseline <89   TSH is still slightly low, with normal free T4 and free T3.  TSI antibodies are not elevated.  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  We will recheck her TFTs in another month.  Philemon Kingdom, MD PhD [facility] Endocrinology

## 2019-10-23 ENCOUNTER — Ambulatory Visit: Payer: BC Managed Care – PPO | Admitting: Internal Medicine

## 2019-10-25 LAB — THYROID STIMULATING IMMUNOGLOBULIN: TSI: <89 % baseline (ref <140)

## 2019-11-13 IMAGING — US US BREAST*R* LIMITED INC AXILLA
1 series · 4 of 4 positions shown · non-contrast
Comparison: Previous exam(s).

CLINICAL DATA: Re-evaluation of reported fibroadenomas, 1 in each
breast, last evaluated on 12/04/2010.

EXAM:
ULTRASOUND OF THE BILATERAL BREAST

[Series 1: us breast*right* limited inc axilla · 4 acquisitions, 4 frames shown]
[im 1/4]
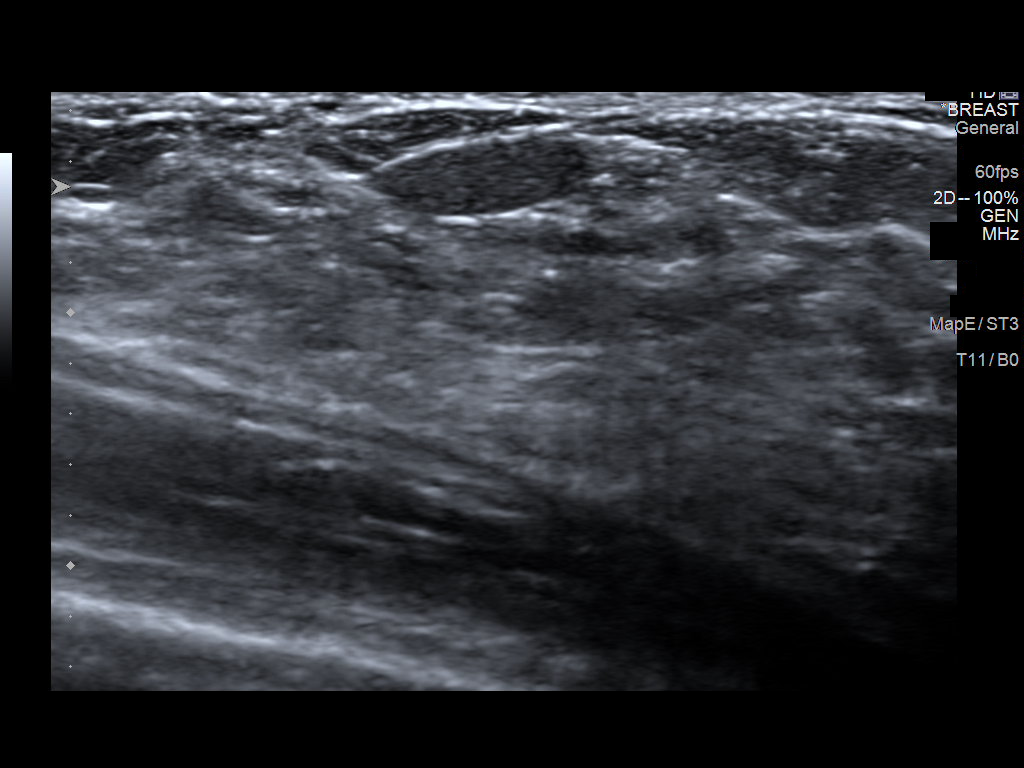
[im 2/4]
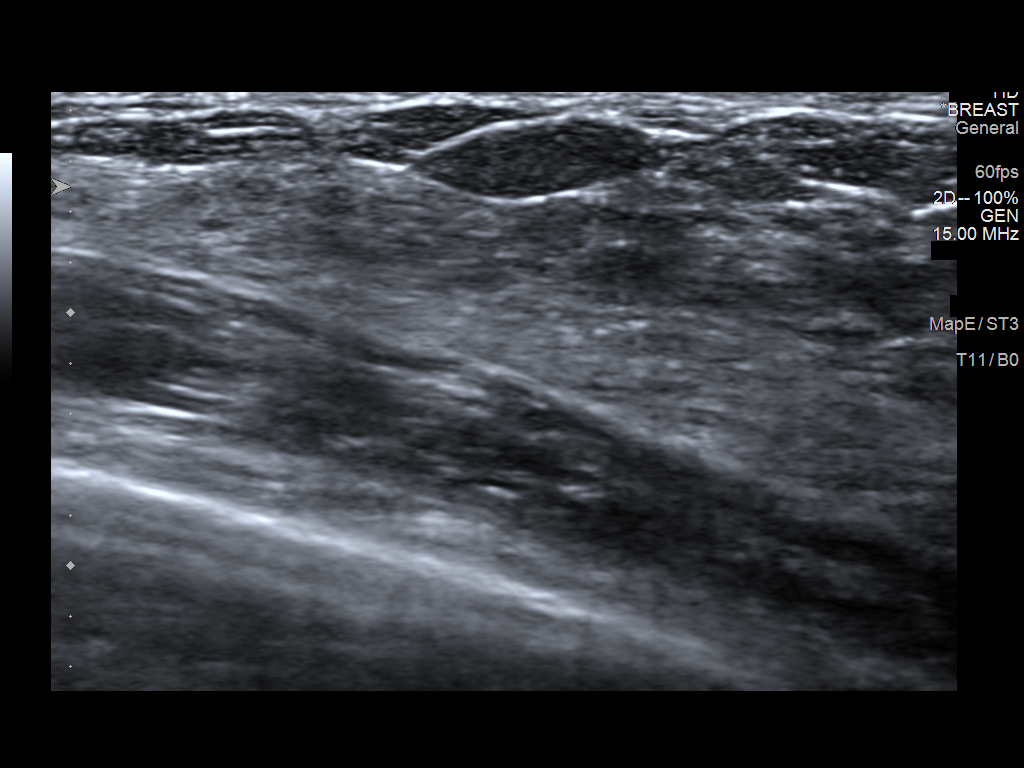
[im 3/4]
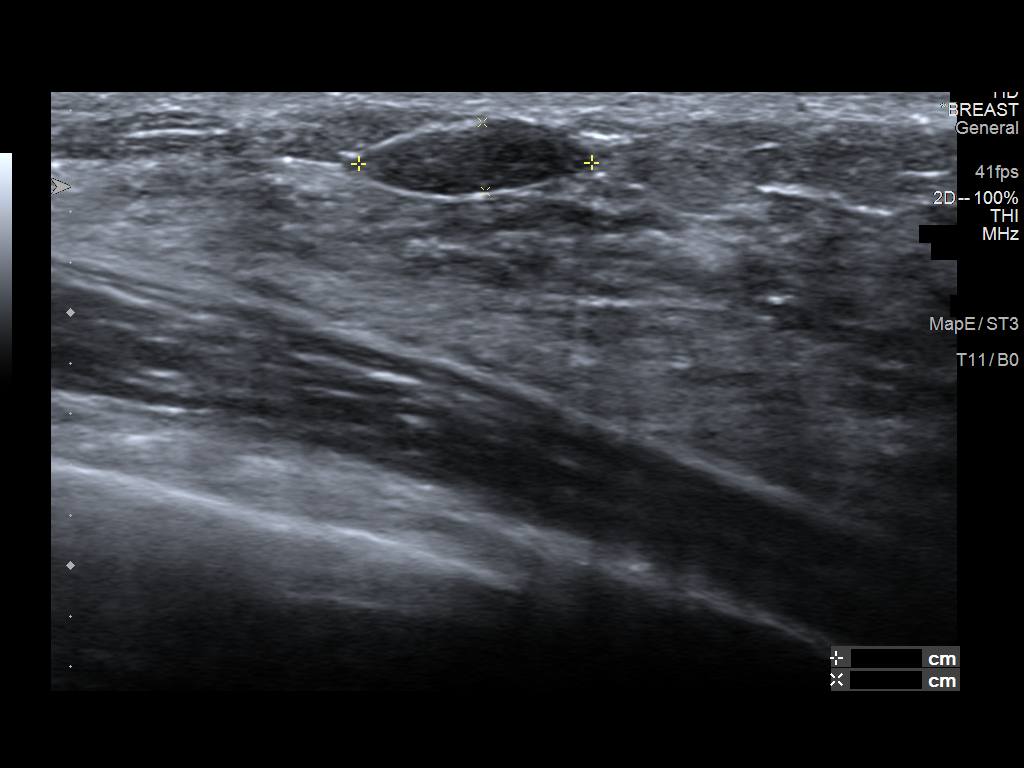
[im 4/4]
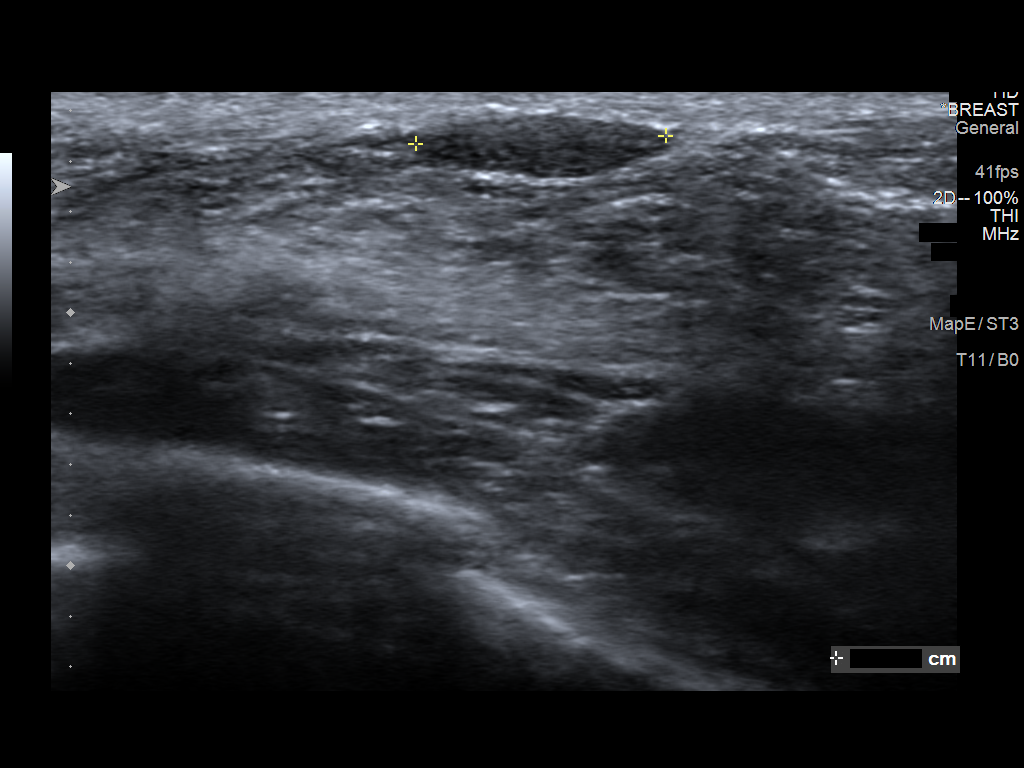

[4 of 4 positions shown; findings below may reference images not displayed]

FINDINGS: Targeted right breast ultrasound is performed, showing an oval,
parallel, hypoechoic circumscribed mass just below the dermis at 10
o'clock, 3 cm the nipple, measuring 10 x 3 x 9 mm, previously 20 x 8
x 17 mm.

Targeted left breast ultrasound is performed, showing a small,
parallel, oval hypoechoic circumscribed mass at 12 o'clock, 3 cm the
nipple, measuring 10 x 3 x 8 mm, previously 18 x 11 x 15 mm.

There are no new masses in either breast.
IMPRESSION: Benign bilateral fibroadenomas, both significantly decreased in size
from the 12/04/2010 exams.

RECOMMENDATION:
Screening mammogram at age 39 unless there are persistent or
intervening clinical concerns. (Code:P5-B-Z0R)

I have discussed the findings and recommendations with the patient.
Results were also provided in writing at the conclusion of the
visit. If applicable, a reminder letter will be sent to the patient
regarding the next appointment.

BI-RADS CATEGORY  2: Benign.

## 2019-11-26 ENCOUNTER — Other Ambulatory Visit (INDEPENDENT_AMBULATORY_CARE_PROVIDER_SITE_OTHER): Payer: BC Managed Care – PPO

## 2019-11-26 ENCOUNTER — Other Ambulatory Visit: Payer: Self-pay

## 2019-11-26 DIAGNOSIS — E0590 Thyrotoxicosis, unspecified without thyrotoxic crisis or storm: Secondary | ICD-10-CM | POA: Diagnosis not present

## 2019-11-26 LAB — TSH: TSH: 0.48 u[IU]/mL (ref 0.35–4.50)

## 2019-11-26 LAB — T3, FREE: T3, Free: 2.8 pg/mL (ref 2.3–4.2)

## 2019-11-26 LAB — T4, FREE: Free T4: 0.74 ng/dL (ref 0.60–1.60)

## 2020-01-05 ENCOUNTER — Encounter: Payer: Self-pay | Admitting: Internal Medicine

## 2020-02-17 ENCOUNTER — Encounter: Payer: Self-pay | Admitting: Internal Medicine

## 2020-02-22 ENCOUNTER — Ambulatory Visit: Payer: BC Managed Care – PPO | Admitting: Internal Medicine

## 2020-05-04 ENCOUNTER — Telehealth (HOSPITAL_COMMUNITY): Payer: Self-pay | Admitting: *Deleted

## 2020-05-04 ENCOUNTER — Encounter (HOSPITAL_COMMUNITY): Payer: Self-pay | Admitting: *Deleted

## 2020-05-04 NOTE — Telephone Encounter (Signed)
Preadmission screen  

## 2020-05-15 ENCOUNTER — Other Ambulatory Visit: Payer: Self-pay | Admitting: Obstetrics and Gynecology

## 2020-05-16 ENCOUNTER — Other Ambulatory Visit: Payer: Self-pay | Admitting: Obstetrics and Gynecology

## 2020-05-16 ENCOUNTER — Other Ambulatory Visit (HOSPITAL_COMMUNITY)
Admission: RE | Admit: 2020-05-16 | Discharge: 2020-05-16 | Disposition: A | Discharge status: Home / Self Care | Payer: BC Managed Care – PPO | Source: Ambulatory Visit | Attending: Obstetrics and Gynecology | Admitting: Obstetrics and Gynecology

## 2020-05-16 DIAGNOSIS — Z01812 Encounter for preprocedural laboratory examination: Secondary | ICD-10-CM | POA: Insufficient documentation

## 2020-05-16 DIAGNOSIS — Z20822 Contact with and (suspected) exposure to covid-19: Secondary | ICD-10-CM | POA: Insufficient documentation

## 2020-05-16 LAB — SARS CORONAVIRUS 2 (TAT 6-24 HRS): SARS Coronavirus 2: NEGATIVE

## 2020-05-16 NOTE — H&P (Signed)
Gina Avila is a 29 y.o. female presenting for scheduled IOL at term.   PNC c/b possible gestational transient thyrotoxicosis (s/p endocrine, no meds, TSH normalized), thrombocytopenia (plt 135 @ 36wks), GBS neg, anxiety (no meds) OB History    Gravida  1   Para  0   Term  0   Preterm  0   AB  0   Living  0     SAB  0   IAB  0   Ectopic  0   Multiple  0   Live Births             Past Medical History:  Diagnosis Date  . GERD (gastroesophageal reflux disease)   . History of breast lump   . OCD (obsessive compulsive disorder)    Past Surgical History:  Procedure Laterality Date  . CERVICAL DISC SURGERY    . ESOPHAGOGASTRODUODENOSCOPY     Dr. Collene Mares  . WISDOM TOOTH EXTRACTION     Family History: family history includes Breast cancer in her paternal aunt; Breast cancer (age of onset: 37) in her mother; CAD (age of onset: 47) in her father; Hypertension in her father; Osteoporosis in her maternal grandmother; Stroke (age of onset: 67) in her paternal grandmother; Thyroid disease in her maternal grandmother, maternal uncle, and mother. Social History:  reports that she has never smoked. She has never used smokeless tobacco. She reports current alcohol use of about 1.0 standard drink of alcohol per week. She reports that she does not use drugs.     Maternal Diabetes: No 1hr 97 Genetic Screening: Normal Maternal Ultrasounds/Referrals: Normal Fetal Ultrasounds or other Referrals:  None Maternal Substance Abuse:  No Significant Maternal Medications:  None Significant Maternal Lab Results:  Group B Strep negative Other Comments:  None  Review of Systems  Constitutional: Negative for chills and fever.  Respiratory: Negative for shortness of breath.   Cardiovascular: Negative for chest pain, palpitations and leg swelling.  Gastrointestinal: Negative for abdominal pain and vomiting.  Neurological: Negative for dizziness, weakness and headaches.   Psychiatric/Behavioral: Negative for suicidal ideas.   History   There were no vitals taken for this visit. Exam Physical Exam Constitutional:      General: She is not in acute distress.    Appearance: She is well-developed and well-nourished.  HENT:     Head: Normocephalic and atraumatic.  Eyes:     Pupils: Pupils are equal, round, and reactive to light.  Cardiovascular:     Rate and Rhythm: Normal rate and regular rhythm.     Heart sounds: No murmur heard. No gallop.   Abdominal:     Tenderness: There is no abdominal tenderness. There is no guarding or rebound.  Genitourinary:    Vagina: Normal.     Uterus: Normal.   Musculoskeletal:        General: Normal range of motion.     Cervical back: Normal range of motion and neck supple.  Skin:    General: Skin is warm and dry.  Neurological:     Mental Status: She is alert and oriented to person, place, and time.     Prenatal labs: ABO, Rh:  Opos Antibody:  neg Rubella:  imm RPR:   nr HBsAg:   neg HIV:   nr GBS:   neg  Assessment/Plan: This is a 29yo G1P0 @ 40 2/7 by LMP c/w TVUS admitted for IOL for favorable cervix at term. PNC c/b as noted in HPI. GBS neg, baby boy.  CE on 12/13 1/60/-2 -Ripen via PV cytotec -Desires epidural -Plan for AROM and pitocin when amenable Anticipate SVD  [name] Gina Avila 05/16/2020, 10:40 PM

## 2020-05-16 NOTE — H&P (Deleted)
  The note originally documented on this encounter has been moved the the encounter in which it belongs.  

## 2020-05-17 ENCOUNTER — Encounter (HOSPITAL_COMMUNITY): Payer: Self-pay | Admitting: Obstetrics and Gynecology

## 2020-05-17 ENCOUNTER — Inpatient Hospital Stay (HOSPITAL_COMMUNITY): Payer: BC Managed Care – PPO | Admitting: Anesthesiology

## 2020-05-17 ENCOUNTER — Other Ambulatory Visit: Payer: Self-pay

## 2020-05-17 ENCOUNTER — Inpatient Hospital Stay (HOSPITAL_COMMUNITY)
Admission: AD | Admit: 2020-05-17 | Discharge: 2020-05-19 | DRG: 807 | Disposition: A | Discharge status: Home / Self Care | Payer: BC Managed Care – PPO | Attending: Obstetrics and Gynecology | Admitting: Obstetrics and Gynecology

## 2020-05-17 ENCOUNTER — Inpatient Hospital Stay (HOSPITAL_COMMUNITY): Payer: BC Managed Care – PPO

## 2020-05-17 DIAGNOSIS — Z885 Allergy status to narcotic agent status: Secondary | ICD-10-CM

## 2020-05-17 DIAGNOSIS — Z3A40 40 weeks gestation of pregnancy: Secondary | ICD-10-CM | POA: Diagnosis not present

## 2020-05-17 DIAGNOSIS — Z20822 Contact with and (suspected) exposure to covid-19: Secondary | ICD-10-CM | POA: Diagnosis present

## 2020-05-17 DIAGNOSIS — O26893 Other specified pregnancy related conditions, third trimester: Secondary | ICD-10-CM | POA: Diagnosis present

## 2020-05-17 LAB — TYPE AND SCREEN
ABO/RH(D): O POS
Antibody Screen: NEGATIVE

## 2020-05-17 LAB — CBC
HCT: 40.5 % (ref 36.0–46.0)
HCT: 43.3 % (ref 36.0–46.0)
Hemoglobin: 14.2 g/dL (ref 12.0–15.0)
Hemoglobin: 14.6 g/dL (ref 12.0–15.0)
MCH: 32.3 pg (ref 26.0–34.0)
MCH: 31.4 pg (ref 26.0–34.0)
MCHC: 35.1 g/dL (ref 30.0–36.0)
MCHC: 33.7 g/dL (ref 30.0–36.0)
MCV: 92.3 fL (ref 80.0–100.0)
MCV: 93.1 fL (ref 80.0–100.0)
Platelets: 143 K/uL — ABNORMAL LOW (ref 150–400)
Platelets: 127 K/uL — ABNORMAL LOW (ref 150–400)
RBC: 4.39 MIL/uL (ref 3.87–5.11)
RBC: 4.65 MIL/uL (ref 3.87–5.11)
RDW: 12.9 % (ref 11.5–15.5)
RDW: 13 % (ref 11.5–15.5)
WBC: 10 K/uL (ref 4.0–10.5)
WBC: 13.8 K/uL — ABNORMAL HIGH (ref 4.0–10.5)
nRBC: 0 % (ref 0.0–0.2)
nRBC: 0 % (ref 0.0–0.2)

## 2020-05-17 LAB — RPR: RPR Ser Ql: NONREACTIVE

## 2020-05-17 LAB — OB RESULTS CONSOLE HIV ANTIBODY (ROUTINE TESTING): HIV: NONREACTIVE

## 2020-05-17 LAB — OB RESULTS CONSOLE GBS: GBS: NEGATIVE

## 2020-05-17 MED ORDER — LIDOCAINE HCL (PF) 1 % IJ SOLN
INTRAMUSCULAR | Status: DC | PRN
  Administered 2020-05-17: 4 mL via EPIDURAL
  Administered 2020-05-17: 6 mL via EPIDURAL

## 2020-05-17 MED ORDER — SOD CITRATE-CITRIC ACID 500-334 MG/5ML PO SOLN: 30.0000 mL | ORAL | Status: DC | PRN

## 2020-05-17 MED ORDER — TERBUTALINE SULFATE 1 MG/ML IJ SOLN: 0.2500 mg | Freq: Once | INTRAMUSCULAR | Status: DC | PRN

## 2020-05-17 MED ORDER — SIMETHICONE 80 MG PO CHEW: 80.0000 mg | CHEWABLE_TABLET | ORAL | Status: DC | PRN

## 2020-05-17 MED ORDER — LACTATED RINGERS IV SOLN: INTRAVENOUS | Status: DC

## 2020-05-17 MED ORDER — ZOLPIDEM TARTRATE 5 MG PO TABS: 5.0000 mg | ORAL_TABLET | Freq: Every evening | ORAL | Status: DC | PRN

## 2020-05-17 MED ORDER — TETANUS-DIPHTH-ACELL PERTUSSIS 5-2.5-18.5 LF-MCG/0.5 IM SUSY: 0.5000 mL | PREFILLED_SYRINGE | Freq: Once | INTRAMUSCULAR | Status: DC

## 2020-05-17 MED ORDER — EPHEDRINE 5 MG/ML INJ: 10.0000 mg | INTRAVENOUS | Status: DC | PRN

## 2020-05-17 MED ORDER — ACETAMINOPHEN 325 MG PO TABS: 650.0000 mg | ORAL_TABLET | ORAL | Status: DC | PRN

## 2020-05-17 MED ORDER — SENNOSIDES-DOCUSATE SODIUM 8.6-50 MG PO TABS
2.0000 | ORAL_TABLET | ORAL | Status: DC
  Administered 2020-05-17 – 2020-05-18 (×2): 2 via ORAL
  Filled 2020-05-17 (×2): qty 2

## 2020-05-17 MED ORDER — BENZOCAINE-MENTHOL 20-0.5 % EX AERO
1.0000 | INHALATION_SPRAY | CUTANEOUS | Status: DC | PRN
Start: 2020-05-17 — End: 2020-05-19
  Administered 2020-05-17: 1 via TOPICAL
  Filled 2020-05-17: qty 56

## 2020-05-17 MED ORDER — IBUPROFEN 600 MG PO TABS
600.0000 mg | ORAL_TABLET | Freq: Four times a day (QID) | ORAL | Status: DC
  Administered 2020-05-17: 17:00:00 600 mg via ORAL
  Filled 2020-05-17: qty 1

## 2020-05-17 MED ORDER — PHENYLEPHRINE 40 MCG/ML (10ML) SYRINGE FOR IV PUSH (FOR BLOOD PRESSURE SUPPORT): 80.0000 ug | PREFILLED_SYRINGE | INTRAVENOUS | Status: DC | PRN

## 2020-05-17 MED ORDER — ONDANSETRON HCL 4 MG PO TABS: 4.0000 mg | ORAL_TABLET | ORAL | Status: DC | PRN

## 2020-05-17 MED ORDER — DIBUCAINE (PERIANAL) 1 % EX OINT: 1.0000 "application " | TOPICAL_OINTMENT | CUTANEOUS | Status: DC | PRN

## 2020-05-17 MED ORDER — FLEET ENEMA 7-19 GM/118ML RE ENEM: 1.0000 | ENEMA | RECTAL | Status: DC | PRN

## 2020-05-17 MED ORDER — ONDANSETRON HCL 4 MG/2ML IJ SOLN: 4.0000 mg | INTRAMUSCULAR | Status: DC | PRN

## 2020-05-17 MED ORDER — DIPHENHYDRAMINE HCL 50 MG/ML IJ SOLN: 12.5000 mg | INTRAMUSCULAR | Status: DC | PRN

## 2020-05-17 MED ORDER — COCONUT OIL OIL
1.0000 "application " | TOPICAL_OIL | Status: DC | PRN
  Administered 2020-05-19: 1 via TOPICAL

## 2020-05-17 MED ORDER — WITCH HAZEL-GLYCERIN EX PADS: 1.0000 "application " | MEDICATED_PAD | CUTANEOUS | Status: DC | PRN

## 2020-05-17 MED ORDER — DIPHENHYDRAMINE HCL 25 MG PO CAPS: 25.0000 mg | ORAL_CAPSULE | Freq: Four times a day (QID) | ORAL | Status: DC | PRN

## 2020-05-17 MED ORDER — OXYTOCIN BOLUS FROM INFUSION
333.0000 mL | Freq: Once | INTRAVENOUS | Status: AC
  Administered 2020-05-17: 12:00:00 333 mL via INTRAVENOUS

## 2020-05-17 MED ORDER — FENTANYL-BUPIVACAINE-NACL 0.5-0.125-0.9 MG/250ML-% EP SOLN
12.0000 mL/h | EPIDURAL | Status: DC | PRN
Start: 2020-05-17 — End: 2020-05-17
  Filled 2020-05-17: qty 250

## 2020-05-17 MED ORDER — SODIUM CHLORIDE (PF) 0.9 % IJ SOLN
INTRAMUSCULAR | Status: DC | PRN
  Administered 2020-05-17: 12 mL/h via EPIDURAL

## 2020-05-17 MED ORDER — LACTATED RINGERS IV SOLN
500.0000 mL | Freq: Once | INTRAVENOUS | Status: AC
  Administered 2020-05-17: 09:00:00 500 mL via INTRAVENOUS

## 2020-05-17 MED ORDER — MISOPROSTOL 25 MCG QUARTER TABLET
25.0000 ug | ORAL_TABLET | ORAL | Status: DC | PRN
  Administered 2020-05-17 (×2): 25 ug via VAGINAL
  Filled 2020-05-17 (×2): qty 1

## 2020-05-17 MED ORDER — OXYTOCIN-SODIUM CHLORIDE 30-0.9 UT/500ML-% IV SOLN
1.0000 m[IU]/min | INTRAVENOUS | Status: DC
  Filled 2020-05-17: qty 500

## 2020-05-17 MED ORDER — ONDANSETRON HCL 4 MG/2ML IJ SOLN: 4.0000 mg | Freq: Four times a day (QID) | INTRAMUSCULAR | Status: DC | PRN

## 2020-05-17 MED ORDER — LIDOCAINE HCL (PF) 1 % IJ SOLN: 30.0000 mL | INTRAMUSCULAR | Status: DC | PRN

## 2020-05-17 MED ORDER — OXYTOCIN-SODIUM CHLORIDE 30-0.9 UT/500ML-% IV SOLN: 2.5000 [IU]/h | INTRAVENOUS | Status: DC

## 2020-05-17 MED ORDER — LACTATED RINGERS IV SOLN
500.0000 mL | INTRAVENOUS | Status: DC | PRN
  Administered 2020-05-17 (×2): 500 mL via INTRAVENOUS

## 2020-05-17 MED ORDER — PRENATAL MULTIVITAMIN CH
1.0000 | ORAL_TABLET | Freq: Every day | ORAL | Status: DC
  Administered 2020-05-18: 13:00:00 1 via ORAL
  Filled 2020-05-17: qty 1

## 2020-05-17 MED ORDER — IBUPROFEN 100 MG/5ML PO SUSP
600.0000 mg | Freq: Four times a day (QID) | ORAL | Status: DC
  Administered 2020-05-17 – 2020-05-19 (×6): 600 mg via ORAL
  Filled 2020-05-17 (×6): qty 30

## 2020-05-17 NOTE — Lactation Note (Signed)
This note was copied from a baby's chart. Lactation Consultation Note  Patient Name: Gina Avila BWLSL'H Date: 05/17/2020 Reason for consult:  (visit in L&D)  [facility] visit at 1 hr of life. Mom noted to have excellent veining. Per parents, infant had not yet latched, but had licked the nipple. Hand expression was briefly done with Mom & a couple of drops of colostrum were given to "Gina Avila" on a gloved finger.  Infant was cueing by sticking out tongue; excellent tongue movement noted. Infant was placed to the other breast (to the breast where hand expression had been done) & assisted with latching. He suckled for about 2 minutes before unlatching on his own.  Lactation to f/u later.   Gina Avila [place] 05/17/2020, 1:30 PM

## 2020-05-17 NOTE — Lactation Note (Signed)
This note was copied from a baby's chart. Lactation Consultation Note  Patient Name: Gina Avila [id number] Date: 05/17/2020 Reason for consult: Initial assessment;Primapara;1st time breastfeeding;Term 12 6hrs old, mom holding baby skin to skin d/t low temp. Mom states baby latched ~77mins in L/D and has latched to breast x2 on MBU, states last feeding was ~430p. Mom states feedings are going well but would like LC to observe next feeding and review hand expression at that time. Mom reports plans to pump and breastfeed to allow dad to help with 1-2 feedings a day. Mom has personal DEBP in room ([place]).  Discussed cue based feedings, wake if >3hrs since last feeding, avoid pumping and pacifier use x38mo unless indicated, optimal skin to skin. Advised mom to call [place] for next feeding. Mom voiced understanding and with no further concerns. BGilliam, RN, IBCLC  Maternal Data Formula Feeding for Exclusion: No Has patient been taught Hand Expression?: Yes Does the patient have breastfeeding experience prior to this delivery?: No  Feeding Feeding Type: Breast Milk   Interventions Interventions: Breast feeding basics reviewed  Lactation Tools Discussed/Used WIC Program: No   Consult Status Consult Status: Follow-up Date: 05/17/20 Follow-up type: In-patient    Bernita Buffy 05/17/2020, 6:03 PM

## 2020-05-17 NOTE — Anesthesia Preprocedure Evaluation (Signed)
Anesthesia Evaluation  Patient identified by MRN, date of birth, ID band Patient awake    Reviewed: Allergy & Precautions, H&P , NPO status , Patient's Chart, lab work & pertinent test results  History of Anesthesia Complications Negative for: history of anesthetic complications  Airway Mallampati: II  TM Distance: >3 FB Neck ROM: full    Dental no notable dental hx.    Pulmonary neg pulmonary ROS,    Pulmonary exam normal        Cardiovascular negative cardio ROS Normal cardiovascular exam Rhythm:regular Rate:Normal     Neuro/Psych negative neurological ROS  negative psych ROS   GI/Hepatic Neg liver ROS, GERD  ,  Endo/Other  negative endocrine ROS  Renal/GU negative Renal ROS  negative genitourinary   Musculoskeletal   Abdominal   Peds  Hematology negative hematology ROS (+)   Anesthesia Other Findings   Reproductive/Obstetrics (+) Pregnancy                             Anesthesia Physical Anesthesia Plan  ASA: II  Anesthesia Plan: Epidural   Post-op Pain Management:    Induction:   PONV Risk Score and Plan:   Airway Management Planned:   Additional Equipment:   Intra-op Plan:   Post-operative Plan:   Informed Consent: I have reviewed the patients History and Physical, chart, labs and discussed the procedure including the risks, benefits and alternatives for the proposed anesthesia with the patient or authorized representative who has indicated his/her understanding and acceptance.       Plan Discussed with:   Anesthesia Plan Comments:         Anesthesia Quick Evaluation

## 2020-05-17 NOTE — Plan of Care (Signed)

## 2020-05-17 NOTE — Anesthesia Procedure Notes (Signed)
Epidural Patient location during procedure: OB Start time: 05/17/2020 9:00 AM End time: 05/17/2020 9:10 AM  Staffing Anesthesiologist: Lidia Collum, MD Performed: anesthesiologist   Preanesthetic Checklist Completed: patient identified, IV checked, risks and benefits discussed, monitors and equipment checked, pre-op evaluation and timeout performed  Epidural Patient position: sitting Prep: DuraPrep Patient monitoring: heart rate, continuous pulse ox and blood pressure Approach: midline Location: L3-L4 Injection technique: LOR air  Needle:  Needle type: Tuohy  Needle gauge: 17 G Needle length: 9 cm Needle insertion depth: 4 cm Catheter type: closed end flexible Catheter size: 19 Gauge Catheter at skin depth: 9 cm Test dose: negative  Assessment Events: blood not aspirated, injection not painful, no injection resistance, no paresthesia and negative IV test  Additional Notes Reason for block:procedure for pain

## 2020-05-17 NOTE — Progress Notes (Signed)
H&P reviewed, no changes  Starting to feel more painful cramping, desires epidural  Category 1 tracing, baseline 135, +accels, -decels, mod var TOCO q1-4 CE 3/80/-2, clear AROM @ 7473 with moderate return  This is a 29yo G1P0 @ 40 2/7 admitted for IOL for favorable cervix at term, GBS neg FWB: Cat 1  MWB: desires epidural, setting up Labor course: s/p AROM 0851 and 2 doses PV cytotec for ripening, begin pitocin and titrate per protocol  Anticipate SVD

## 2020-05-17 NOTE — Lactation Note (Addendum)
This note was copied from a baby's chart. Lactation Consultation Note  Patient Name: Gina Avila JARWP'T Date: 05/17/2020  request from mom for latch assist.  Baby Gina Gina Avila now 12 hours old.  Infant spitty at this time.  Did not attempt to breastfeed.  Urged mom to feed on cue and 8-12 or more times day. Let mom know that no one would be here tonight but we would check on her tomorrow. Call lactation as needed.   Maternal Data    Feeding Feeding Type: Breast Fed  [facility] Score                   Interventions    Lactation Tools Discussed/Used     Consult Status      Gina Avila 05/17/2020, 10:35 PM

## 2020-05-18 LAB — CBC
HCT: 35.2 % — ABNORMAL LOW (ref 36.0–46.0)
Hemoglobin: 12.3 g/dL (ref 12.0–15.0)
MCH: 32.5 pg (ref 26.0–34.0)
MCHC: 34.9 g/dL (ref 30.0–36.0)
MCV: 93.1 fL (ref 80.0–100.0)
Platelets: 101 10*3/uL — ABNORMAL LOW (ref 150–400)
RBC: 3.78 MIL/uL — ABNORMAL LOW (ref 3.87–5.11)
RDW: 13.4 % (ref 11.5–15.5)
WBC: 13.5 10*3/uL — ABNORMAL HIGH (ref 4.0–10.5)
nRBC: 0 % (ref 0.0–0.2)

## 2020-05-18 MED ORDER — IBUPROFEN 100 MG/5ML PO SUSP: 600.0000 mg | Freq: Four times a day (QID) | ORAL | 2 refills | Status: DC

## 2020-05-18 MED ORDER — ACETAMINOPHEN 325 MG PO TABS: 650.0000 mg | ORAL_TABLET | ORAL | 1 refills | Status: DC | PRN

## 2020-05-18 NOTE — Progress Notes (Signed)
Post Partum Day 1 Subjective: no complaints, up ad lib and tolerating PO  Would like early d/c today if baby able to go  Objective: Blood pressure 104/74, pulse 62, temperature 98 F (36.7 C), temperature source Oral, resp. rate 18, height 5\' 4"  (1.626 m), weight 65 kg, SpO2 96 %, unknown if currently breastfeeding.  Physical Exam:  General: alert and cooperative Lochia: appropriate Uterine Fundus: firm   Recent Labs    05/17/20 0813 05/18/20 0525  HGB 14.6 12.3  HCT 43.3 35.2*    Assessment/Plan: Discharge home this PM if baby able to go Circumcision done and care reviewed with parents   LOS: 1 day   Logan Bores 05/18/2020, 10:17 AM

## 2020-05-18 NOTE — Progress Notes (Signed)
MOB was referred for history of depression/anxiety. * Referral screened out by Clinical Social Worker because none of the following criteria appear to apply: ~ History of anxiety/depression during this pregnancy, or of post-partum depression following prior delivery. ~ Diagnosis of anxiety and/or depression within last 3 years. Per [facility] records it is noted that MOB was diagnosed with anxiety in 2014 and doing well off meds at this time for pregnancy.  OR * MOB's symptoms currently being treated with medication and/or therapy.    CSW aware that MOB scored 1 on Edinburgh with no concerns noted by CSW please consult if new need arises or if MOB wishes to speak with CSW.    Gina Avila, MSW, LCSW [facility] [place] [facility] [place] [phone]

## 2020-05-18 NOTE — Anesthesia Postprocedure Evaluation (Signed)
Anesthesia Post Note  Patient: Gina Avila  Procedure(s) Performed: AN AD HOC LABOR EPIDURAL     Patient location during evaluation: Mother Baby Anesthesia Type: Epidural Level of consciousness: awake and alert and oriented Pain management: satisfactory to patient Vital Signs Assessment: post-procedure vital signs reviewed and stable Respiratory status: respiratory function stable Cardiovascular status: stable Postop Assessment: no headache, no backache, epidural receding, patient able to bend at knees, no signs of nausea or vomiting, adequate PO intake and able to ambulate Anesthetic complications: no   No complications documented.  Last Vitals:  Vitals:   05/17/20 2350 05/18/20 0505  BP: 105/75 104/74  Pulse: (!) 57 62  Resp: 18 18  Temp: 36.6 C 36.7 C  SpO2:      Last Pain:  Vitals:   05/18/20 0740  TempSrc:   PainSc: 0-No pain   Pain Goal: Patients Stated Pain Goal: 5 (05/17/20 1703)              Epidural/Spinal Function Cutaneous sensation: Normal sensation (05/18/20 0740), Patient able to flex knees: Yes (05/18/20 0740), Patient able to lift hips off bed: Yes (05/18/20 0740), Back pain beyond tenderness at insertion site: No (05/18/20 0740), Progressively worsening motor and/or sensory loss: No (05/18/20 0740), Bowel and/or bladder incontinence post epidural: No (05/18/20 0740)  Ynez Eugenio

## 2020-05-18 NOTE — Lactation Note (Signed)
This note was copied from a baby's chart. Lactation Consultation Note  Patient Name: Gina Avila IDPOE'U Date: 05/18/2020 Reason for consult: Follow-up assessment   Infant is 72 hours old. Infant is 40+2 weeks, -3 % wt loss.  Mother reports that infant had a good feed at 12:25 on both breast.  Infant had a large stool and wet diaper.  Lots of teaching done with parents.  Parents had lots of questions.   Assist mother with infant STS in football hold. Infant latched well., assist with flanging lips. Observed infant with frequent swallowing. Encouraged mother to do breast compression.   Discussed cluster feeding and cue base feeding . Infant should feed 8-12 times or more in 24 hours.      Maternal Data    Feeding Feeding Type: Breast Fed  LATCH Score Latch: Grasps breast easily, tongue down, lips flanged, rhythmical sucking.  Audible Swallowing: Spontaneous and intermittent  Type of Nipple: Everted at rest and after stimulation  Comfort (Breast/Nipple): Soft / non-tender  Hold (Positioning): Assistance needed to correctly position infant at breast and maintain latch.  LATCH Score: 9  Interventions Interventions: Breast feeding basics reviewed;Assisted with latch;Skin to skin;Hand express;Breast compression;Adjust position;Support pillows;Position options;Hand pump  Lactation Tools Discussed/Used     Consult Status Consult Status: Follow-up Date: 05/19/20 Follow-up type: In-patient    Jess Barters [facility] 05/18/2020, 3:23 PM

## 2020-05-18 NOTE — Discharge Summary (Addendum)
Postpartum Discharge Summary       Patient Name: Gina Avila DOB: 05-02-1991 MRN: [id number]  Date of admission: 05/17/2020 Delivery date:05/17/2020  Delivering provider: Deliah Boston  Date of discharge: 05/19/2020  Admitting diagnosis: [redacted] weeks gestation of pregnancy [Z3A.40] Intrauterine pregnancy: [redacted]w[redacted]d     Secondary diagnosis:  Active Problems:   [redacted] weeks gestation of pregnancy  Additional problems: none    Discharge diagnosis: Term Pregnancy Delivered                                              Post partum procedures:none Augmentation: AROM, Pitocin and Cytotec Complications: None  Hospital course: Induction of Labor With Vaginal Delivery   29 y.o. yo G1P1001 at [redacted]w[redacted]d was admitted to the hospital 05/17/2020 for induction of labor.  Indication for induction: Elective.  Patient had an uncomplicated labor course as follows: Membrane Rupture Time/Date: 8:51 AM ,05/17/2020   Delivery Method:Vaginal, Spontaneous  Episiotomy: None  Lacerations:  Perineal;Sulcus;1st degree  Details of delivery can be found in separate delivery note.  Patient had a routine postpartum course. Patient is discharged home 05/19/20.  Newborn Data: Birth date:05/17/2020  Birth time:12:03 PM  Gender:Female  Living status:Living  Apgars:8 ,9  Weight:3246 g   Magnesium Sulfate received: No BMZ received: No Rhophylac:No   Physical exam  Vitals:   05/18/20 0505 05/18/20 1717 05/18/20 2139 05/19/20 0511  BP: 104/74 106/88 105/79 104/69  Pulse: 62 77 (!) 55 60  Resp: 18 18 18 18   Temp: 98 F (36.7 C) 97.7 F (36.5 C) 98 F (36.7 C) 98 F (36.7 C)  TempSrc: Oral Oral Oral Oral  SpO2:   98% 98%  Weight:      Height:       General: alert and cooperative Lochia: appropriate Uterine Fundus: firm  Labs: Lab Results  Component Value Date   WBC 13.5 (H) 05/18/2020   HGB 12.3 05/18/2020   HCT 35.2 (L) 05/18/2020   MCV 93.1 05/18/2020   PLT 101 (L) 05/18/2020   CMP Latest  Ref Rng & Units 10/29/2018  Glucose 65 - 99 mg/dL 92  BUN 6 - 20 mg/dL 9  Creatinine 0.57 - 1.00 mg/dL 0.96  Sodium 134 - 144 mmol/L 140  Potassium 3.5 - 5.2 mmol/L 4.6  Chloride 96 - 106 mmol/L 101  CO2 20 - 29 mmol/L 25  Calcium 8.7 - 10.2 mg/dL 9.8  Total Protein 6.0 - 8.5 g/dL 7.1  Total Bilirubin 0.0 - 1.2 mg/dL 0.4  Alkaline Phos 39 - 117 IU/L 56  AST 0 - 40 IU/L 20  ALT 0 - 32 IU/L 11   Edinburgh Score: Edinburgh Postnatal Depression Scale Screening Tool 05/17/2020  I have been able to laugh and see the funny side of things. 0  I have looked forward with enjoyment to things. 0  I have blamed myself unnecessarily when things went wrong. 0  I have been anxious or worried for no good reason. 1  I have felt scared or panicky for no good reason. 0  Things have been getting on top of me. 0  I have been so unhappy that I have had difficulty sleeping. 0  I have felt sad or miserable. 0  I have been so unhappy that I have been crying. 0  The thought of harming myself has occurred to me. 0  Edinburgh Postnatal Depression Scale Total 1     After visit meds:  Allergies as of 05/19/2020      Reactions   Codeine Nausea And Vomiting   headaches   Latex Rash      Medication List    TAKE these medications   acetaminophen 325 MG tablet Commonly known as: Tylenol Take 2 tablets (650 mg total) by mouth every 4 (four) hours as needed (for pain scale < 4).   ibuprofen 100 MG/5ML suspension Commonly known as: ADVIL Take 30 mLs (600 mg total) by mouth every 6 (six) hours.   PRENATAL VITAMIN PO Take by mouth.        Discharge home in stable condition Infant Feeding: Breast Infant Disposition:home with mother Discharge instruction: per After Visit Summary and Postpartum booklet. Activity: Advance as tolerated. Pelvic rest for 6 weeks.  Diet: routine diet Future Appointments:No future appointments. Follow up Visit:  Follow-up Information    Shivaji, [name], MD. Schedule  an appointment as soon as possible for a visit in 6 week(s).   Specialty: Obstetrics and Gynecology Why: postpartum Contact information: [place] [address] [phone]                Please schedule this patient for a In person postpartum visit in 6 weeks with the following provider: MD.  Delivery mode:  Vaginal, Spontaneous  Anticipated Birth Control:  Unsure   05/19/2020 Clarene Duke, MD

## 2020-05-19 ENCOUNTER — Ambulatory Visit: Payer: Self-pay

## 2020-05-19 NOTE — Lactation Note (Signed)
This note was copied from a baby's chart. Lactation Consultation Note  Patient Name: Gina Avila HUDJS'H Date: 05/19/2020 Reason for consult: Follow-up assessment   Mother reports that infant cluster fed lots last evening. She reports that infant fed for 50 and 55 mins. She reports that she began offering both breast at a feeding .  Mother reports that he nipples are slightly sore. She was given comfort gels.   Mother to follow up with [facility] as needed.    Maternal Data    Feeding Feeding Type: Breast Fed  LATCH Score Latch: Grasps breast easily, tongue down, lips flanged, rhythmical sucking.  Audible Swallowing: A few with stimulation  Type of Nipple: Everted at rest and after stimulation  Comfort (Breast/Nipple): Filling, red/small blisters or bruises, mild/mod discomfort  Hold (Positioning): No assistance needed to correctly position infant at breast.  LATCH Score: 8  Interventions Interventions: Breast feeding basics reviewed;Assisted with latch;Skin to skin;Hand express;Coconut oil  Lactation Tools Discussed/Used     Consult Status Consult Status: Complete    Darla Lesches 05/19/2020, 11:54 AM

## 2020-05-19 NOTE — Progress Notes (Signed)
PPD #2 Doing well, stayed yesterday due to issues with baby Afeb, VSS D/c home

## 2020-06-01 ENCOUNTER — Telehealth (HOSPITAL_COMMUNITY): Payer: Self-pay | Admitting: Lactation Services

## 2020-06-01 NOTE — Telephone Encounter (Signed)
Mother called with questions regarding mastitis.  Mother is being treated per her OB/GYN with antibiotics.  Her baby is 41 weeks old and for most feedings will only breastfeed on one breast.  He is stooling/voiding appropriately and gaining weight.  Discussed rest, fluids and pumping for 5 min on infected breast for comfort only as needed.  Keep breastfeeding on demand & continue to offer second breast.

## 2020-09-29 ENCOUNTER — Ambulatory Visit: Payer: BC Managed Care – PPO | Admitting: Family Medicine

## 2020-09-29 ENCOUNTER — Other Ambulatory Visit: Payer: Self-pay

## 2020-09-29 ENCOUNTER — Encounter: Payer: Self-pay | Admitting: Family Medicine

## 2020-09-29 VITALS — BP 110/60 | HR 72 | Wt 125.4 lb

## 2020-09-29 DIAGNOSIS — J3489 Other specified disorders of nose and nasal sinuses: Secondary | ICD-10-CM

## 2020-09-29 DIAGNOSIS — M799 Soft tissue disorder, unspecified: Secondary | ICD-10-CM

## 2020-09-29 DIAGNOSIS — R239 Unspecified skin changes: Secondary | ICD-10-CM | POA: Insufficient documentation

## 2020-09-29 NOTE — Progress Notes (Signed)
   Subjective:    Patient ID: Gina Avila, female    DOB: Sep 12, 1990, 30 y.o.   MRN: [id number]  HPI Chief Complaint  Patient presents with  . dent in leg    Dent in leg on right thigh. Been there for a month   She is here with complaints of a one month history of an indentation in her right thigh. States she noticed the area by looking at it and does not have any pain or discomfort of any type.  She had a baby 4 months ago.   Occasional tingling in her right foot which seems unrelated. No numbness or leg muscle weakness.   No other concerns.   No fever, chills, night sweats, weight loss, abdominal pain, N/V/D.   Reviewed allergies, medications, past medical, surgical, family, and social history.     Review of Systems Pertinent positives and negatives in the history of present illness.     Objective:   Physical Exam BP 110/60   Pulse 72   Wt 125 lb 6.4 oz (56.9 kg)   BMI 21.52 kg/m   Bilateral lower extremities with normal sensation, pulses, range of motion and strength. There is an area of indentation noted the upper right thigh without any muscle tenderness, separation, weakness.       Assessment & Plan:  Abnormality of soft tissue on examination  Rhinorrhea  Discussed that this may be related to recent pregnancy and body changes or potentially a developing lipoma.  Discussed that there are no red flag symptoms.  I also had Dorothea Ogle, PA examined the patient and reassured her.  We will do watchful waiting over the next couple of months.  She will report back if she has any new symptoms or if she feels that the area is becoming larger and we will consider an ultrasound at that time. She may continue on a nondrowsy antihistamine for allergies.  Follow-up as needed.

## 2020-09-30 ENCOUNTER — Ambulatory Visit: Payer: BC Managed Care – PPO | Admitting: Physical Therapy

## 2020-10-08 ENCOUNTER — Encounter: Payer: Self-pay | Admitting: Family Medicine

## 2020-10-10 ENCOUNTER — Encounter: Payer: Self-pay | Admitting: Family Medicine

## 2020-10-10 ENCOUNTER — Other Ambulatory Visit: Payer: Self-pay

## 2020-10-10 ENCOUNTER — Telehealth (INDEPENDENT_AMBULATORY_CARE_PROVIDER_SITE_OTHER): Payer: BC Managed Care – PPO | Admitting: Family Medicine

## 2020-10-10 VITALS — Temp 98.2°F | Wt 125.0 lb

## 2020-10-10 DIAGNOSIS — R110 Nausea: Secondary | ICD-10-CM | POA: Diagnosis not present

## 2020-10-10 DIAGNOSIS — R143 Flatulence: Secondary | ICD-10-CM | POA: Diagnosis not present

## 2020-10-10 DIAGNOSIS — T781XXA Other adverse food reactions, not elsewhere classified, initial encounter: Secondary | ICD-10-CM

## 2020-10-10 NOTE — Addendum Note (Signed)
Addended by: Girtha Rm on: 10/10/2020 09:53 AM   Modules accepted: Orders

## 2020-10-10 NOTE — Progress Notes (Addendum)
   Subjective:  Documentation for virtual audio and video telecommunications through [place] encounter:  The patient was located at home. 2 patient identifiers used.  The provider was located in the office. The patient did consent to this visit and is aware of possible charges through their insurance for this visit.  The other persons participating in this telemedicine service were none. Time spent on call was 17 minutes and in review of previous records 20 minutes total.  This virtual service is not related to other E/M service within previous 7 days.   Patient ID: Gina Avila, female    DOB: 03-04-1991, 30 y.o.   MRN: [id number]  HPI Chief Complaint  Patient presents with  . Allergies    Allergies with Breastfeeding, feeling gassy,    Complains of a long history of stomach issues.  Recently she has been breast feeding. States she has been increasingly gassy and nauseated. States her gas smells significantly worse.  States her son has also been having GI upset and she has been giving him Gas-X to help with his stomach issues which seems to be working.  States she seems to have some sensitivities to food but is not sure which ones are causing her discomfort.  She would like to have testing to look for obvious food allergies or intolerances.  States she saw GI in the past and knows she has acid reflux.  She has been dealing with this okay by avoiding food triggers for GERD. She is watching her diet.   She has seen an allergist in the past for environmental allergies but never for food allergies.  Denies fever, chills, chest pain, shortness of breath, cough, vomiting or diarrhea.   Review of Systems Pertinent positives and negatives in the history of present illness.     Objective:   Physical Exam Temp 98.2 F (36.8 C)   Wt 125 lb (56.7 kg)   BMI 21.46 kg/m   Alert and oriented in no acute distress.  Respirations unlabored.  Normal speech, mood and thought  process.      Assessment & Plan:  Food sensitivity with gastrointestinal symptoms - Plan: Food Allergy Profile, CANCELED: Food Allergy Profile  Flatulence - Plan: Food Allergy Profile, CANCELED: Food Allergy Profile  Nausea - Plan: Food Allergy Profile, CANCELED: Food Allergy Profile  Patient is a currently breast-feeding mother  She appears to be having some food sensitivities, experiencing increased flatulence and GI symptoms.  Now that she is breast-feeding she is concerned that her son is also having similar symptoms.  She has been giving him Gas-X.  Encouraged her to contact her OB/GYN office and ask for assistance from the lactation consultant or a nutritionist that they would recommend to assist her with breast-feeding and food sensitivities for her child. I will order lab test to look for food allergies.  I will follow-up pending her results.  We can consider referral back to GI or allergist if she would like.

## 2020-10-14 ENCOUNTER — Other Ambulatory Visit: Payer: Self-pay

## 2020-10-14 ENCOUNTER — Other Ambulatory Visit: Payer: BC Managed Care – PPO

## 2020-10-21 LAB — FOOD ALLERGY PROFILE
Allergen Corn, IgE: <0.1 kU/L
Clam IgE: <0.1 kU/L
Codfish IgE: <0.1 kU/L
Egg White IgE: <0.1 kU/L
Milk IgE: <0.1 kU/L
Peanut IgE: <0.1 kU/L
Scallop IgE: <0.1 kU/L
Sesame Seed IgE: <0.1 kU/L
Shrimp IgE: <0.1 kU/L
Soybean IgE: <0.1 kU/L
Walnut IgE: <0.1 kU/L
Wheat IgE: <0.1 kU/L

## 2020-12-19 ENCOUNTER — Encounter: Payer: Self-pay | Admitting: Internal Medicine

## 2020-12-19 ENCOUNTER — Ambulatory Visit (INDEPENDENT_AMBULATORY_CARE_PROVIDER_SITE_OTHER): Payer: BC Managed Care – PPO | Admitting: Internal Medicine

## 2020-12-19 ENCOUNTER — Other Ambulatory Visit (INDEPENDENT_AMBULATORY_CARE_PROVIDER_SITE_OTHER): Payer: BC Managed Care – PPO

## 2020-12-19 ENCOUNTER — Other Ambulatory Visit: Payer: Self-pay

## 2020-12-19 VITALS — BP 110/76 | HR 80 | Ht 64.0 in | Wt 124.0 lb

## 2020-12-19 DIAGNOSIS — E0590 Thyrotoxicosis, unspecified without thyrotoxic crisis or storm: Secondary | ICD-10-CM

## 2020-12-19 LAB — TSH: TSH: 0.53 u[IU]/mL (ref 0.35–5.50)

## 2020-12-19 LAB — T4, FREE: Free T4: 0.88 ng/dL (ref 0.60–1.60)

## 2020-12-19 LAB — T3, FREE: T3, Free: 3.8 pg/mL (ref 2.3–4.2)

## 2020-12-19 NOTE — Patient Instructions (Signed)
Please stop at the lab.  Please return to see me if the labs are normal.

## 2020-12-19 NOTE — Progress Notes (Signed)
Patient ID: Gina Avila, female   DOB: 06-13-1990, 30 y.o.   MRN: [id number]   This visit occurred during the SARS-CoV-2 public health emergency.  Safety protocols were in place, including screening questions prior to the visit, additional usage of staff PPE, and extensive cleaning of exam room while observing appropriate contact time as indicated for disinfecting solutions.   HPI  Gina Avila is a 30 y.o.-year-old female, initially referred by her OB/GYN doctor, Dr. 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 Last OV 1 year and 2 mo ago.  At that time, she was [redacted] weeks pregnant.  Interim hx: She gave birth 05/2020 >> healthy baby boy. She is breastfeeding >> no menses yet. She has been having lightheadedness, cold sensation on scalp skin. Also, when breastfeeding, she has some blurry vision. She has occasional tremors - right hand - after cervical spine sx 2019. Otherwise, she feels well, w/o complaints. She is still on prenatal vitamin.  Reviewed hx: At last OV, she was [redacted] weeks pregnant (first pregnancy). She did not have a prior h/o thyroid ds.  She was found to have a low TSH on first trimester screening labs.  On recheck, TFTs still showed a suppressed TSH and normal free thyroid hormones.  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  She was lost for f/u after out visit from 10/2019.  Repeat thyroid  labs from 11/2019 were normal.  I reviewed pt's thyroid tests: Lab Results  Component Value Date   TSH 0.48 11/26/2019   TSH 0.02 (L) 10/19/2019   TSH 1.170 10/29/2018   TSH 1.030 03/21/2018   TSH 1.370 10/24/2017   TSH 1.12 04/02/2017   TSH 0.86 03/21/2016   FREET4 0.74 11/26/2019   FREET4 1.01 10/19/2019   FREET4 1.07 10/29/2018   FREET4 1.27 03/21/2018   FREET4 1.28 10/24/2017   T3FREE 2.8 11/26/2019   T3FREE 2.9 10/19/2019  10/13/2019: TSH 0.027 (0.45-4.5), free T4 1.62  (0.82-1.77) 01/2019: TSH 0.6  Antithyroid antibodies: Lab Results  Component Value Date   TSI <89 10/19/2019   Pt denies: - feeling nodules in neck - hoarseness - dysphagia - choking - SOB with lying down She does cough after she eats. She has GERD. She saw GI >> had EGD  - hiatal hernia. Tums help.  At last OV, she had: - + weight loss (2 lbs) but also weight gain in the setting of pregnancy - initially had food aversion, now starting to tolerate food better - + fatigue - no excessive sweating/heat intolerance, + cold intolerance - no tremors, only when hungry - chronic - + chronic anxiety - + palpitations before the pregnancy >> resolved - no hyperdefecation - no hair loss  At this visit, pt denies: - unusual/unintentional weight loss - heat intolerance - palpitations - anxiety - hyperdefecation - hair loss  Pt has FH of thyroid ds.  In mother, maternal grandmother and maternal uncle.  No FH of thyroid cancer. No h/o radiation tx to head or neck.  No recent contrast studies. No steroid use. No herbal supplements. No Biotin use except in MVI.  Pt. also has a history of cervical spine sxs.  ROS: + see HPI  Past Medical History:  Diagnosis Date   GERD (gastroesophageal reflux disease)    History of breast lump    OCD (obsessive compulsive disorder)    Past Surgical History:  Procedure Laterality Date   CERVICAL DISC SURGERY     ESOPHAGOGASTRODUODENOSCOPY  Dr. Collene Mares   WISDOM TOOTH EXTRACTION     Social History   Socioeconomic History   Marital status: Married    Spouse name: Not on file   Number of children: Not on file   Years of education: Not on file   Highest education level: Not on file  Occupational History   Not on file  Tobacco Use   Smoking status: Never   Smokeless tobacco: Never  Vaping Use   Vaping Use: Never used  Substance and Sexual Activity   Alcohol use: Yes    Alcohol/week: 1.0 standard drink    Types: 1 Standard drinks or  equivalent per week   Drug use: No   Sexual activity: Yes    Partners: Male    Birth control/protection: Pill  Other Topics Concern   Not on file  Social History Narrative   Not on file   Social Determinants of Health   Financial Resource Strain: Not on file  Food Insecurity: Not on file  Transportation Needs: Not on file  Physical Activity: Not on file  Stress: Not on file  Social Connections: Not on file  Intimate Partner Violence: Not on file   Current Outpatient Medications on File Prior to Visit  Medication Sig Dispense Refill   Prenatal Vit-Fe Fumarate-FA (PRENATAL VITAMIN Avila) Take by mouth.     No current facility-administered medications on file prior to visit.   Allergies  Allergen Reactions   Codeine Nausea And Vomiting    headaches   Latex Rash   Family History  Problem Relation Age of Onset   Breast cancer Mother 18   Thyroid disease Mother    Hypertension Father    CAD Father 29   Thyroid disease Maternal Uncle    Breast cancer Paternal Aunt    Thyroid disease Maternal Grandmother    Osteoporosis Maternal Grandmother    Stroke Paternal Grandmother 51    PE: BP 110/76 (BP Location: Right Arm, Patient Position: Sitting, Cuff Size: Normal)   Pulse 80   Ht 5\' 4"  (1.626 m)   Wt 124 lb (56.2 kg)   SpO2 98%   BMI 21.28 kg/m  Wt Readings from Last 3 Encounters:  12/19/20 124 lb (56.2 kg)  10/10/20 125 lb (56.7 kg)  09/29/20 125 lb 6.4 oz (56.9 kg)   Constitutional: normal weight, in NAD Eyes: PERRLA, EOMI, no exophthalmos, no lid lag, no stare ENT: moist mucous membranes, + mild thyromegaly, no thyroid bruits, no cervical lymphadenopathy Cardiovascular: RRR, No MRG Respiratory: CTA B Gastrointestinal: abdomen soft, NT, ND, BS+ Musculoskeletal: no deformities, strength intact in all 4 Skin: moist, warm, no rashes Neurological: no tremor with outstretched hands, DTR normal in all 4  ASSESSMENT: 1. H/o gestational transient thyrotoxicosis  (GTT)  PLAN:  1. Patient with h/o subclinical thyrotoxicosis during the beginning of her pregnancy, most likely related to GTT. Graves Ab's (TSIs) were not elevated at last OV.  - She was lost for f/u after our last OV from 1 year and 2 mo ago but TFTs were normal 1 mo later. - at today's visit, she denies thyrotoxic sxs: no wt loss, palpitations, tremors, heat intolerance - she does have some dizziness and cold sensation on scalp skin >> would like to recheck her TFTs - also, she does not have tachycardia, tremors but continues to have a slightly enlarged thyroid on physical exam - no neck compression sxs - we will recheck her TFTs today - will have her return to see me as  needed  Component     Latest Ref Rng & Units 12/19/2020  TSH     0.35 - 5.50 uIU/mL 0.53  T4,Free(Direct)     0.60 - 1.60 ng/dL 0.88  Triiodothyronine,Free,Serum     2.3 - 4.2 pg/mL 3.8   Normal TFTs.  Philemon Kingdom, MD PhD [facility] Endocrinology

## 2021-06-29 DIAGNOSIS — Z130 Encounter for screening for diseases of the blood and blood-forming organs and certain disorders involving the immune mechanism: Secondary | ICD-10-CM | POA: Diagnosis not present

## 2021-06-29 DIAGNOSIS — Z1389 Encounter for screening for other disorder: Secondary | ICD-10-CM | POA: Diagnosis not present

## 2021-06-29 DIAGNOSIS — Z01419 Encounter for gynecological examination (general) (routine) without abnormal findings: Secondary | ICD-10-CM | POA: Diagnosis not present

## 2021-06-29 DIAGNOSIS — Z6821 Body mass index (BMI) 21.0-21.9, adult: Secondary | ICD-10-CM | POA: Diagnosis not present

## 2021-09-28 ENCOUNTER — Encounter: Payer: Self-pay | Admitting: Family Medicine

## 2021-09-28 ENCOUNTER — Ambulatory Visit: Payer: 59 | Admitting: Family Medicine

## 2021-09-28 VITALS — BP 108/66 | HR 84 | Temp 98.1°F | Wt 122.6 lb

## 2021-09-28 DIAGNOSIS — R350 Frequency of micturition: Secondary | ICD-10-CM

## 2021-09-28 LAB — POCT URINALYSIS DIP (PROADVANTAGE DEVICE)
Bilirubin, UA: NEGATIVE
Glucose, UA: NEGATIVE mg/dL
Ketones, POC UA: NEGATIVE mg/dL
Leukocytes, UA: NEGATIVE
Nitrite, UA: NEGATIVE
Specific Gravity, Urine: 1.025
Urobilinogen, Ur: 0.2
pH, UA: 6 (ref 5.0–8.0)

## 2021-09-28 MED ORDER — AMOXICILLIN 875 MG PO TABS
875.0000 mg | ORAL_TABLET | Freq: Two times a day (BID) | ORAL | 0 refills | Status: DC
Start: 2021-09-28 — End: 2022-04-05

## 2021-09-28 NOTE — Progress Notes (Signed)
? ?  Subjective:  ? ? Patient ID: Gina Avila, female    DOB: 19-Oct-1990, 31 y.o.   MRN: [id number] ? ?HPI ?She has a 2-week history of difficulty with frequency and urgency but no abdominal pain, fever, chills.  She is breast-feeding.  Her child is 62 months old. ? ? ?Review of Systems ? ?   ?Objective:  ? Physical Exam ?Alert and in no distress otherwise not examined.  Urine microscopic showed contaminated urine.  Dipstick showed trace blood. ? ? ? ?   ?Assessment & Plan:  ?Frequency of micturition - Plan: POCT Urinalysis DIP (Proadvantage Device), amoxicillin (AMOXIL) 875 MG tablet ?I explained that I thought this was a urinary tract infection.  I will place her on Amoxil which will be safe.  Recommend Azo Standard for the frequency symptoms. ? ?

## 2021-09-28 NOTE — Patient Instructions (Signed)
Azo Standard for a couple of days and I will give you a 3-day course ?

## 2021-12-01 ENCOUNTER — Encounter: Payer: Self-pay | Admitting: Internal Medicine

## 2022-02-07 ENCOUNTER — Encounter: Payer: Self-pay | Admitting: Internal Medicine

## 2022-02-22 DIAGNOSIS — L718 Other rosacea: Secondary | ICD-10-CM | POA: Diagnosis not present

## 2022-02-22 DIAGNOSIS — D2262 Melanocytic nevi of left upper limb, including shoulder: Secondary | ICD-10-CM | POA: Diagnosis not present

## 2022-02-22 DIAGNOSIS — L728 Other follicular cysts of the skin and subcutaneous tissue: Secondary | ICD-10-CM | POA: Diagnosis not present

## 2022-03-13 ENCOUNTER — Encounter: Payer: Self-pay | Admitting: Internal Medicine

## 2022-04-05 ENCOUNTER — Ambulatory Visit: Payer: 59 | Admitting: Medical

## 2022-04-05 ENCOUNTER — Encounter: Payer: Self-pay | Admitting: Medical

## 2022-04-05 VITALS — BP 110/70 | HR 71 | Ht 66.0 in | Wt 129.4 lb

## 2022-04-05 DIAGNOSIS — Z1321 Encounter for screening for nutritional disorder: Secondary | ICD-10-CM

## 2022-04-05 DIAGNOSIS — Z23 Encounter for immunization: Secondary | ICD-10-CM

## 2022-04-05 DIAGNOSIS — K219 Gastro-esophageal reflux disease without esophagitis: Secondary | ICD-10-CM

## 2022-04-05 DIAGNOSIS — M549 Dorsalgia, unspecified: Secondary | ICD-10-CM | POA: Insufficient documentation

## 2022-04-05 DIAGNOSIS — Z0000 Encounter for general adult medical examination without abnormal findings: Secondary | ICD-10-CM

## 2022-04-05 DIAGNOSIS — H43393 Other vitreous opacities, bilateral: Secondary | ICD-10-CM

## 2022-04-05 DIAGNOSIS — Z8349 Family history of other endocrine, nutritional and metabolic diseases: Secondary | ICD-10-CM

## 2022-04-05 DIAGNOSIS — Z1389 Encounter for screening for other disorder: Secondary | ICD-10-CM | POA: Diagnosis not present

## 2022-04-05 DIAGNOSIS — Z13220 Encounter for screening for lipoid disorders: Secondary | ICD-10-CM | POA: Diagnosis not present

## 2022-04-05 LAB — POCT URINALYSIS DIP (PROADVANTAGE DEVICE)
Bilirubin, UA: NEGATIVE
Glucose, UA: NEGATIVE mg/dL
Ketones, POC UA: NEGATIVE mg/dL
Leukocytes, UA: NEGATIVE
Nitrite, UA: NEGATIVE
Protein Ur, POC: NEGATIVE mg/dL
Specific Gravity, Urine: 1.01
Urobilinogen, Ur: NEGATIVE
pH, UA: 6 (ref 5.0–8.0)

## 2022-04-05 NOTE — Patient Instructions (Signed)
Consider Pepcid/Famotidine '20mg'$  at night for reflux.  You can continue tums/antacids as needed

## 2022-04-05 NOTE — Progress Notes (Addendum)
Subjective:   HPI  Gina Avila is a 31 y.o. female who presents for Chief Complaint  Patient presents with   cpe,    Fasting CPE, had Disk in neck surgery a couple years ago and has a bulge in right side of neck and neck pain and hands tingling and numbness and just wants to take a look at this. Would like TSH, and if any vitamin deficiency   Has an appt in February with OBGYN-    Patient Care Team: Sydnei Ohaver, Camelia Eng, PA-C as PCP - [facility], [facility], [facility] [place] Sees dentist Dr. Juanita Craver, GI Dr. Kary Kos, Glenford Peers, NP neurosurgery  Concerns: She has several concerns.  She gets floaters in her vision every now and then.  She has vertigo now and then.  She has question about lymph nodes in her neck that she can feel on the right side  She has a history neck surgery for ruptured disc.  She is starting to get some symptoms similar to what she had before she had neck surgery including upper back pain and neck pain.  She gets tension and pressure in her upper back on the right all the time.  She is right-handed.  She wants her thyroid checked since she has a family history of thyroid disease.  She notes some vitamins check.  She has normal periods not heavy  She is currently breast-feeding, has a 62-monthold.  Gynecological history:  1 pregnancies, 1 live births   Reviewed their medical, surgical, family, social, medication, and allergy history and updated chart as appropriate.   Past Medical History:  Diagnosis Date   GERD (gastroesophageal reflux disease)    History of breast lump    OCD (obsessive compulsive disorder)     Family History  Problem Relation Age of Onset   Breast cancer Mother [phone]  Thyroid disease Mother    Hypertension Father    CAD Father [phone]  Thyroid disease Maternal Uncle    Breast cancer Paternal Aunt    Thyroid disease Maternal Grandmother    Osteoporosis Maternal  Grandmother    Stroke Paternal Grandmother [phone]   No current outpatient medications on file.  Allergies  Allergen Reactions   Codeine Nausea And Vomiting    headaches   Latex Rash    Review of Systems  Constitutional:  Negative for chills, fever, malaise/fatigue and weight loss.  HENT:  Negative for congestion, ear pain, hearing loss, sore throat and tinnitus.   Eyes:  Negative for blurred vision, pain and redness.  Respiratory:  Negative for cough, hemoptysis and shortness of breath.   Cardiovascular:  Negative for chest pain, palpitations, orthopnea, claudication and leg swelling.  Gastrointestinal:  Positive for heartburn. Negative for abdominal pain, blood in stool, constipation, diarrhea, nausea and vomiting.  Genitourinary:  Negative for dysuria, flank pain, frequency, hematuria and urgency.  Musculoskeletal:  Positive for back pain, myalgias and neck pain. Negative for falls and joint pain.  Skin:  Negative for itching and rash.  Neurological:  Negative for dizziness, tingling, speech change, weakness and headaches.  Endo/Heme/Allergies:  Negative for polydipsia. Does not bruise/bleed easily.  Psychiatric/Behavioral:  Negative for depression and memory loss. The patient is not nervous/anxious and does not have insomnia.         04/05/2022   10:17 AM 10/10/2020    8:38 AM 09/29/2020   12:04 PM 04/02/2017    3:29 PM  Depression screen PHQ 2/9  Decreased  Interest 0 0 0 0  Down, Depressed, Hopeless 0 0 0 0  PHQ - 2 Score 0 0 0 0       Objective:  BP 110/70   Pulse 71   Ht '5\' 6"'$  (1.676 m)   Wt 129 lb 6.4 oz (58.7 kg)   Breastfeeding Yes   BMI 20.89 kg/m   General appearance: alert, no distress, WD/WN, Caucasian female Skin: unremarkable HEENT: normocephalic, conjunctiva/corneas normal, sclerae anicteric, PERRLA, EOMi, nares patent, no discharge or erythema, pharynx normal Oral cavity: MMM, tongue normal, teeth in good repair Neck: supple, palpable small lymph nodes  posterior cervical, but  no lymphadenopathy, no thyromegaly, no masses, normal ROM, no bruits Chest: non tender, normal shape and expansion Heart: RRR, normal S1, S2, no murmurs Lungs: CTA bilaterally, no wheezes, rhonchi, or rales Abdomen: +bs, soft, non tender, non distended, no masses, no hepatomegaly, no splenomegaly, no bruits Back: non tender, normal ROM, no scoliosis Musculoskeletal: tender right paraspinal upper back and supraspinatus, otherwise  upper extremities non tender, no obvious deformity, normal ROM throughout, lower extremities non tender, no obvious deformity, normal ROM throughout Extremities: no edema, no cyanosis, no clubbing Pulses: 2+ symmetric, upper and lower extremities, normal cap refill Neurological: alert, oriented x 3, CN2-12 intact, strength normal upper extremities and lower extremities, sensation normal throughout, DTRs 2+ throughout, no cerebellar signs, gait normal Psychiatric: normal affect, behavior normal, pleasant  Breast/gyn/rectal - deferred to gynecology     Assessment and Plan :   Encounter Diagnoses  Name Primary?   Encounter for health maintenance examination in adult Yes   Needs flu shot    Screening for lipid disorders    Encounter for vitamin deficiency screening    Family history of thyroid disease    Screening for hematuria or proteinuria    Gastroesophageal reflux disease, unspecified whether esophagitis present    Upper back pain    Vitreous floaters of both eyes      This visit was a preventative care visit, also known as wellness visit or routine physical.   Topics typically include healthy lifestyle, diet, exercise, preventative care, vaccinations, sick and well care, proper use of emergency dept and after hours care, as well as other concerns.     Recommendations: Continue to return yearly for your annual wellness and preventative care visits.  This gives [place] a chance to discuss healthy lifestyle, exercise, vaccinations,  review your chart record, and perform screenings where appropriate.  I recommend you see your eye doctor yearly for routine vision care.  I recommend you see your dentist yearly for routine dental care including hygiene visits twice yearly.   Vaccination recommendations were reviewed Immunization History  Administered Date(s) Administered   Influenza, Seasonal, Injecte, Preservative Fre 03/16/2016   Influenza,inj,Quad PF,6+ Mos 03/11/2017, 04/14/2018, 04/05/2022   Influenza,inj,quad, With Preservative 02/17/2019   Influenza-Unspecified 03/11/2017   Tdap 03/04/2020   Counseled on the influenza virus vaccine.  Vaccine information sheet given.  Influenza vaccine given after consent obtained.   Screening for cancer: Colon cancer screening: Age 89  Breast cancer screening: You should perform a self breast exam monthly.   We reviewed recommendations for regular mammograms and breast cancer screening.  Cervical cancer screening: We reviewed recommendations for pap smear screening.   Skin cancer screening: Check your skin regularly for new changes, growing lesions, or other lesions of concern Come in for evaluation if you have skin lesions of concern.  Lung cancer screening: If you have a greater than 20 pack  year history of tobacco use, then you may qualify for lung cancer screening with a chest CT scan.   Please call your insurance company to inquire about coverage for this test.  We currently don't have screenings for other cancers besides breast, cervical, colon, and lung cancers.  If you have a strong family history of cancer or have other cancer screening concerns, please let me know.    Bone health: Get at least 150 minutes of aerobic exercise weekly Get weight bearing exercise at least once weekly Bone density test:  A bone density test is an imaging test that uses a type of X-ray to measure the amount of calcium and other minerals in your bones. The test may be used to  diagnose or screen you for a condition that causes weak or thin bones (osteoporosis), predict your risk for a broken bone (fracture), or determine how well your osteoporosis treatment is working. The bone density test is recommended for females 17 and older, or females or males <99 if certain risk factors such as thyroid disease, long term use of steroids such as for asthma or rheumatological issues, vitamin D deficiency, estrogen deficiency, family history of osteoporosis, self or family history of fragility fracture in first degree relative.    Heart health: Get at least 150 minutes of aerobic exercise weekly Limit alcohol It is important to maintain a healthy blood pressure and healthy cholesterol numbers  Heart disease screening: Screening for heart disease includes screening for blood pressure, fasting lipids, glucose/diabetes screening, BMI height to weight ratio, reviewed of smoking status, physical activity, and diet.    Goals include blood pressure 120/80 or less, maintaining a healthy lipid/cholesterol profile, preventing diabetes or keeping diabetes numbers under good control, not smoking or using tobacco products, exercising most days per week or at least 150 minutes per week of exercise, and eating healthy variety of fruits and vegetables, healthy oils, and avoiding unhealthy food choices like fried food, fast food, high sugar and high cholesterol foods.    Other tests may possibly include EKG test, CT coronary calcium score, echocardiogram, exercise treadmill stress test.     Medical care options: I recommend you continue to seek care here first for routine care.  We try really hard to have available appointments Monday through Friday daytime hours for sick visits, acute visits, and physicals.  Urgent care should be used for after hours and weekends for significant issues that cannot wait till the next day.  The emergency department should be used for significant potentially  life-threatening emergencies.  The emergency department is expensive, can often have long wait times for less significant concerns, so try to utilize primary care, urgent care, or telemedicine when possible to avoid unnecessary trips to the emergency department.  Virtual visits and telemedicine have been introduced since the pandemic started in 2020, and can be convenient ways to receive medical care.  We offer virtual appointments as well to assist you in a variety of options to seek medical care.    Separate significant issues discussed: We discussed her several concerns  Advise she do a routine eye doctor visit regarding floaters which can be normal symptoms, but she is at an age she should start seeing an eye doctor  Back pain, muscle spasm-advised regular stretching, consider massage therapy regularly, if symptoms get more consistent or worsen then recheck follow-up  GERD-she had significant gastritis on 2018 EGD.  She will begin famotidine for now and continue Tums as needed.  Advised that she  may need to go back to see gastroenterology if she continues to have worsening symptoms despite avoiding GERD triggers.  She is currently breast-feeding   Adalee was seen today for cpe,.  Diagnoses and all orders for this visit:  Encounter for health maintenance examination in adult -     Comprehensive metabolic panel -     CBC -     VITAMIN D 25 Hydroxy (Vit-D Deficiency, Fractures) -     TSH + free T4 -     Iron -     POCT Urinalysis DIP (Proadvantage Device) -     Lipid panel  Needs flu shot -     Flu Vaccine QUAD 75moIM (Fluarix, Fluzone & Alfiuria Quad PF)  Screening for lipid disorders -     Lipid panel  Encounter for vitamin deficiency screening -     VITAMIN D 25 Hydroxy (Vit-D Deficiency, Fractures) -     Iron  Family history of thyroid disease -     TSH + free T4  Screening for hematuria or proteinuria -     POCT Urinalysis DIP (Proadvantage  Device)  Gastroesophageal reflux disease, unspecified whether esophagitis present  Upper back pain  Vitreous floaters of both eyes    Follow-up pending labs, yearly for physical

## 2022-04-06 LAB — CBC
Hematocrit: 44.4 % (ref 34.0–46.6)
Hemoglobin: 14.6 g/dL (ref 11.1–15.9)
MCH: 29.3 pg (ref 26.6–33.0)
MCHC: 32.9 g/dL (ref 31.5–35.7)
MCV: 89 fL (ref 79–97)
Platelets: 171 10*3/uL (ref 150–450)
RBC: 4.99 x10E6/uL (ref 3.77–5.28)
RDW: 12.3 % (ref 11.7–15.4)
WBC: 5.8 10*3/uL (ref 3.4–10.8)

## 2022-04-06 LAB — COMPREHENSIVE METABOLIC PANEL
ALT: 6 IU/L (ref 0–32)
AST: 16 IU/L (ref 0–40)
Albumin/Globulin Ratio: 2 (ref 1.2–2.2)
Albumin: 4.7 g/dL (ref 3.9–4.9)
Alkaline Phosphatase: 69 IU/L (ref 44–121)
BUN/Creatinine Ratio: 13 (ref 9–23)
BUN: 11 mg/dL (ref 6–20)
Bilirubin Total: 0.4 mg/dL (ref 0.0–1.2)
CO2: 26 mmol/L (ref 20–29)
Calcium: 9.3 mg/dL (ref 8.7–10.2)
Chloride: 101 mmol/L (ref 96–106)
Creatinine, Ser: 0.85 mg/dL (ref 0.57–1.00)
Globulin, Total: 2.3 g/dL (ref 1.5–4.5)
Glucose: 90 mg/dL (ref 70–99)
Potassium: 4.4 mmol/L (ref 3.5–5.2)
Sodium: 138 mmol/L (ref 134–144)
Total Protein: 7 g/dL (ref 6.0–8.5)
eGFR: 93 mL/min/{1.73_m2} (ref >59)

## 2022-04-06 LAB — TSH+FREE T4
Free T4: 1.04 ng/dL (ref 0.82–1.77)
TSH: 0.882 u[IU]/mL (ref 0.450–4.500)

## 2022-04-06 LAB — LIPID PANEL
Chol/HDL Ratio: 3 ratio (ref 0.0–4.4)
Cholesterol, Total: 195 mg/dL (ref 100–199)
HDL: 64 mg/dL (ref >39)
LDL Chol Calc (NIH): 119 mg/dL — ABNORMAL HIGH (ref 0–99)
Triglycerides: 63 mg/dL (ref 0–149)
VLDL Cholesterol Cal: 12 mg/dL (ref 5–40)

## 2022-04-06 LAB — IRON: Iron: 64 ug/dL (ref 27–159)

## 2022-04-06 LAB — VITAMIN D 25 HYDROXY (VIT D DEFICIENCY, FRACTURES): Vit D, 25-Hydroxy: 32 ng/mL (ref 30.0–100.0)

## 2022-04-12 DIAGNOSIS — D485 Neoplasm of uncertain behavior of skin: Secondary | ICD-10-CM | POA: Diagnosis not present

## 2022-04-12 DIAGNOSIS — D179 Benign lipomatous neoplasm, unspecified: Secondary | ICD-10-CM | POA: Diagnosis not present

## 2022-04-12 DIAGNOSIS — L718 Other rosacea: Secondary | ICD-10-CM | POA: Diagnosis not present

## 2022-07-12 DIAGNOSIS — Z124 Encounter for screening for malignant neoplasm of cervix: Secondary | ICD-10-CM | POA: Diagnosis not present

## 2022-07-20 ENCOUNTER — Ambulatory Visit: Payer: 59 | Admitting: Medical

## 2022-07-26 DIAGNOSIS — M542 Cervicalgia: Secondary | ICD-10-CM | POA: Diagnosis not present

## 2022-07-26 DIAGNOSIS — M5412 Radiculopathy, cervical region: Secondary | ICD-10-CM | POA: Diagnosis not present

## 2022-07-26 DIAGNOSIS — R599 Enlarged lymph nodes, unspecified: Secondary | ICD-10-CM | POA: Diagnosis not present

## 2022-12-27 ENCOUNTER — Ambulatory Visit: Payer: Managed Care, Other (non HMO) | Admitting: Family Medicine

## 2022-12-27 ENCOUNTER — Encounter: Payer: Self-pay | Admitting: Family Medicine

## 2022-12-27 VITALS — BP 100/60 | HR 68 | Temp 98.2°F | Ht 64.0 in | Wt 128.8 lb

## 2022-12-27 DIAGNOSIS — E049 Nontoxic goiter, unspecified: Secondary | ICD-10-CM | POA: Diagnosis not present

## 2022-12-27 DIAGNOSIS — K219 Gastro-esophageal reflux disease without esophagitis: Secondary | ICD-10-CM

## 2022-12-27 DIAGNOSIS — R350 Frequency of micturition: Secondary | ICD-10-CM | POA: Diagnosis not present

## 2022-12-27 DIAGNOSIS — R079 Chest pain, unspecified: Secondary | ICD-10-CM | POA: Diagnosis not present

## 2022-12-27 LAB — POCT URINALYSIS DIP (PROADVANTAGE DEVICE)
Glucose, UA: NEGATIVE mg/dL
Ketones, POC UA: NEGATIVE mg/dL
Leukocytes, UA: NEGATIVE
Nitrite, UA: NEGATIVE
Specific Gravity, Urine: 1.02
Urobilinogen, Ur: 0.2
pH, UA: 6.5 (ref 5.0–8.0)

## 2022-12-27 NOTE — Progress Notes (Signed)
Chief Complaint  Patient presents with   Urinary Frequency    Urinary frequency and feels like she is not emptying her bladder completely x 4-5 days. She said she feels like she has an air bubble in her chest, and she has to breathe shallow to burst the bubble that is there. Only happens once every 3-4 months and only lasts about a minute.     Symptoms of urinary frequency started 4-5 days ago. 2 days ago she drank some cranberry juice. Symptoms have improved the last couple of days.  Noticed symptoms more when laying down (had just voided, laid down to sleep, and had to get up again). She feels like she isn't emptying completely.  She does have urine each times she goes (not the urgency/frequency of a UTI).  She hasn't had any known UTI's (treated for possible one in 09/2021 when she had similar symptoms of frequency).  She denies dysuria, hematuria. She does have some leakage with cough/sneeze (since childbirth, just intermittently). Bowels are normal, denies constipation.  She reports trace blood in urine is long-standing (also noted in pregnancy).  Second issue-- Every 3-4 months she feels like she gets an air bubble in her chest. It is sharp, painful, and hard to take a deep breath.  By lightly breathing, it seems to dissipate over a minute.  Feels like it "pops".  If she were to take a deep breath, it will cause sharp pain.  Doesn't feel like she needs to burp. The discomfort is more left-sided. She has known GERD, and also some palpitations.  Symptoms of GERD is feeling like she has something stuck in her throat. No dysphagia. GERD improved since eliminating coffee.   She is also asking for her thyroid to be checked.  Strong family of thyroid disease (hyper and hypo, no cancer). No thyroid symptoms--some fatigue (but has 2.5 yo child, thinks is justified), some frequent bowels. No changes to hair/skin/weight, temperature intolerance, moods.  She didn't mention this at visit, but on  chart review after visit, noted that she saw Dr. Elvera Lennox in 12/2020. She had h/o gestational transient thyrotoxicosis during pregnancy in 2021.  Lab Results  Component Value Date   TSH 0.882 04/05/2022   PMH, PSH, SH reviewed  No outpatient encounter medications on file as of 12/27/2022.   No facility-administered encounter medications on file as of 12/27/2022.   Allergies  Allergen Reactions   Codeine Nausea And Vomiting    headaches   Latex Rash   ROS: no fever, chills, URI symptoms. Some PND.  GERD/throat sx per HPI. No n/v/d. No thyroid complaints (see HPI). No bleeding, bruising, rash.  No exertional chest pain or DOE. Urinary complaints per HPI. No vaginal discharge or bleeding.   PHYSICAL EXAM:  BP 100/60   Pulse 68   Temp 98.2 F (36.8 C) (Tympanic)   Ht 5\' 4"  (1.626 m)   Wt 128 lb 12.8 oz (58.4 kg)   LMP 12/03/2022 (Exact Date)   Breastfeeding No   BMI 22.11 kg/m   Wt Readings from Last 3 Encounters:  12/27/22 128 lb 12.8 oz (58.4 kg)  04/05/22 129 lb 6.4 oz (58.7 kg)  09/28/21 122 lb 9.6 oz (55.6 kg)   Well-appearing female in no distress.   HEENT: conjunctiva and sclera are clear, EOMI Neck: no lymphadenopathy or mass (small, rubbery, nontender LN L neck).  Thyromegaly is noted, nontender, no nodules Chest: nontender, no costochondral junction tenderness, or muscle tenderness Heart: regular rate and rhythm Abdomen: soft,  nontender, no organomegaly or mass Lung: clear bilaterally Psych: mildly anxious.  Full range of affect, normal hygiene/grooming Neuro: alert and oriented, cranial nerves intact, normal gait.  Urine: SG 1.020, trace blood, small bili, trace protein.  Neg nitrite, leuks, glu, ketones   ASSESSMENT/PLAN:  Urinary frequency - concentrated urine, no e/o infection. Poss not emptying well.  Rec pelvic floor strengthening, hydration. f/u if sx persist/worsen - Plan: POCT Urinalysis DIP (Proadvantage Device)  Goiter - check [place].  no palpable  nodule.  no sx of thyroid dysfunction - Plan: US THYROID  Chest pain, unspecified type - unclear etiology, not reproducible. Sharp and short-lived, not likely cardiac. Reassured. To keep journal to try and find associations  Gastroesophageal reflux disease, unspecified whether esophagitis present - briefly reviewed proper diet/behavioral measures

## 2022-12-27 NOTE — Patient Instructions (Signed)
Your thyroid feels somewhat enlarged.  I don't feel any masses or nodules. We are going to check a thyroid ultrasound to ensure there is nothing going on that needs to be treated differently. Your last blood tests for the function of the thyroid was normal back in November.  Stay well hydrated. Return if you have persistent or worsening urinary symptoms.  Your urine did not appear to have any infection. We talked about possible etiologies. Consider doing regular Kegel's exercises to strength your pelvic floor muscles.  Keep track of the chest "bubble" symptom, and whether or not it is associated with palpitations, or other symptoms.  Return sooner than your next physical if you have persistent/worsening/more frequent symptoms or other concerns.

## 2023-01-03 ENCOUNTER — Ambulatory Visit
Admission: RE | Admit: 2023-01-03 | Discharge: 2023-01-03 | Disposition: A | Discharge status: Home / Self Care | Payer: Managed Care, Other (non HMO) | Source: Ambulatory Visit | Attending: Family Medicine | Admitting: Family Medicine

## 2023-01-03 DIAGNOSIS — E049 Nontoxic goiter, unspecified: Secondary | ICD-10-CM

## 2023-04-11 ENCOUNTER — Ambulatory Visit: Payer: Managed Care, Other (non HMO) | Admitting: Medical

## 2023-04-11 ENCOUNTER — Encounter: Payer: Self-pay | Admitting: Medical

## 2023-04-11 VITALS — BP 110/68 | HR 70 | Ht 66.0 in | Wt 130.0 lb

## 2023-04-11 DIAGNOSIS — L719 Rosacea, unspecified: Secondary | ICD-10-CM

## 2023-04-11 DIAGNOSIS — R202 Paresthesia of skin: Secondary | ICD-10-CM

## 2023-04-11 DIAGNOSIS — E010 Iodine-deficiency related diffuse (endemic) goiter: Secondary | ICD-10-CM | POA: Diagnosis not present

## 2023-04-11 DIAGNOSIS — Z0000 Encounter for general adult medical examination without abnormal findings: Secondary | ICD-10-CM

## 2023-04-11 DIAGNOSIS — R002 Palpitations: Secondary | ICD-10-CM | POA: Diagnosis not present

## 2023-04-11 DIAGNOSIS — Z13220 Encounter for screening for lipoid disorders: Secondary | ICD-10-CM

## 2023-04-11 MED ORDER — BRIMONIDINE TARTRATE 0.33 % EX GEL: 1.0000 | Freq: Every day | CUTANEOUS | 2 refills | Status: DC

## 2023-04-11 NOTE — Progress Notes (Signed)
Subjective:   HPI  Gina Avila is a 32 y.o. female who presents for Chief Complaint  Patient presents with   Annual Exam    CPE fasting labs, spot on neck maybe lymph node, and random heart palpitations, two time in three weeks, feels off, see's [facility], last PAP was 2023.     Patient Care Team: Garrie Elenes, Kermit Balo, PA-C as PCP - [facility], [facility], [facility] [place] [facility] Sees dentist Dr. Charna Elizabeth, GI Dr. Donalee Citrin, Verlin Dike, NP neurosurgery   Concerns: She notes some recent palpitations, feels heart out of rhythm.  Last a few beats, but has had some more frequently.  No SOB, no edema, no syncope.  She notes lymph node palpable on right neck x 6 months  Randomly gets cold sensations in head, feels like water dripping on head, and this sensation lasts 20-30 seconds.  Has had this once every 2 months recently.  No other unusual sensations.   Had recent updated MRI of neck given prior neck surgery and some neck discomfort.  Had recently done some updated physical therapy.  She has a history neck surgery for ruptured disc.     Reviewed their medical, surgical, family, social, medication, and allergy history and updated chart as appropriate.   Allergies  Allergen Reactions   Codeine Nausea And Vomiting    headaches   Latex Rash    Past Medical History:  Diagnosis Date   GERD (gastroesophageal reflux disease)    History of breast lump    OCD (obsessive compulsive disorder)     No current outpatient medications on file prior to visit.   No current facility-administered medications on file prior to visit.      Current Outpatient Medications:    Brimonidine Tartrate 0.33 % GEL, Apply 1 Application topically daily., Disp: 30 g, Rfl: 2  Family History  Problem Relation Age of Onset   Breast cancer Mother 29   Thyroid disease Mother    Hypertension Father    CAD Father 63   Thyroid disease Maternal Uncle     Breast cancer Paternal Aunt    Thyroid disease Maternal Grandmother    Osteoporosis Maternal Grandmother    Stroke Paternal Grandmother 16    Past Surgical History:  Procedure Laterality Date   CERVICAL DISC SURGERY  05/2018   [facility]   ESOPHAGOGASTRODUODENOSCOPY     Dr. Loreta Ave   WISDOM TOOTH EXTRACTION       Review of Systems  Constitutional:  Negative for chills, fever, malaise/fatigue and weight loss.  HENT:  Negative for congestion, ear pain, hearing loss, sore throat and tinnitus.   Eyes:  Negative for blurred vision, pain and redness.  Respiratory:  Negative for cough, hemoptysis and shortness of breath.   Cardiovascular:  Positive for palpitations. Negative for chest pain, orthopnea, claudication and leg swelling.  Gastrointestinal:  Negative for abdominal pain, blood in stool, constipation, diarrhea, nausea and vomiting.  Genitourinary:  Negative for dysuria, flank pain, frequency, hematuria and urgency.  Musculoskeletal:  Positive for neck pain. Negative for falls, joint pain and myalgias.  Skin:  Negative for itching and rash.  Neurological:  Positive for tingling. Negative for dizziness, speech change, weakness and headaches.  Endo/Heme/Allergies:  Negative for polydipsia. Does not bruise/bleed easily.  Psychiatric/Behavioral:  Negative for depression and memory loss. The patient is not nervous/anxious and does not have insomnia.         04/11/2023    9:46 AM 04/05/2022  10:17 AM 10/10/2020    8:38 AM 09/29/2020   12:04 PM 04/02/2017    3:29 PM  Depression screen PHQ 2/9  Decreased Interest 0 0 0 0 0  Down, Depressed, Hopeless 0 0 0 0 0  PHQ - 2 Score 0 0 0 0 0       Objective:  BP 110/68   Pulse 70   Ht 5\' 6"  (1.676 m)   Wt 130 lb (59 kg)   LMP 04/01/2023   BMI 20.98 kg/m   Wt Readings from Last 3 Encounters:  04/11/23 130 lb (59 kg)  12/27/22 128 lb 12.8 oz (58.4 kg)  04/05/22 129 lb 6.4 oz (58.7 kg)    General appearance: alert, no  distress, WD/WN, Caucasian female Skin: rosy red tip of nose, some flushing of cheeks, no papules or pustules, otherwise unremarkable HEENT: normocephalic, conjunctiva/corneas normal, sclerae anicteric, PERRLA, EOMi, nares patent, no discharge or erythema, pharynx normal Oral cavity: MMM, tongue normal, teeth in good repair Neck: supple, palpable small lymph nodes posterior cervical, but  no lymphadenopathy, no thyromegaly, no masses, normal ROM, no bruits Chest: non tender, normal shape and expansion Heart: RRR, normal S1, S2, no murmurs Lungs: CTA bilaterally, no wheezes, rhonchi, or rales Abdomen: +bs, soft, non tender, non distended, no masses, no hepatomegaly, no splenomegaly, no bruits Back: non tender, normal ROM, no scoliosis Musculoskeletal: tender right paraspinal upper back and supraspinatus, otherwise  upper extremities non tender, no obvious deformity, normal ROM throughout, lower extremities non tender, no obvious deformity, normal ROM throughout Extremities: no edema, no cyanosis, no clubbing Pulses: 2+ symmetric, upper and lower extremities, normal cap refill Neurological: alert, oriented x 3, CN2-12 intact, strength normal upper extremities and lower extremities, sensation normal throughout, DTRs 2+ throughout, no cerebellar signs, gait normal Psychiatric: normal affect, behavior normal, pleasant  Breast/gyn/rectal - deferred to gynecology     Assessment and Plan :   Encounter Diagnoses  Name Primary?   Encounter for health maintenance examination in adult Yes   Screening for lipid disorders    Paresthesia    Rosacea    Palpitation    Thyromegaly     This visit was a preventative care visit, also known as wellness visit or routine physical.   Topics typically include healthy lifestyle, diet, exercise, preventative care, vaccinations, sick and well care, proper use of emergency dept and after hours care, as well as other concerns.     Recommendations: Continue to  return yearly for your annual wellness and preventative care visits.  This gives [place] a chance to discuss healthy lifestyle, exercise, vaccinations, review your chart record, and perform screenings where appropriate.  I recommend you see your eye doctor yearly for routine vision care.  I recommend you see your dentist yearly for routine dental care including hygiene visits twice yearly.   Vaccination recommendations were reviewed Immunization History  Administered Date(s) Administered   Influenza, Seasonal, Injecte, Preservative Fre 03/16/2016   Influenza,inj,Quad PF,6+ Mos 03/11/2017, 04/14/2018, 04/05/2022   Influenza,inj,quad, With Preservative 02/17/2019   Influenza-Unspecified 03/16/2016, 03/11/2017   Tdap 02/16/2020, 03/04/2020   Had flu shot last month.     Screening for cancer: Colon cancer screening: Age 22  Breast cancer screening: You should perform a self breast exam monthly.   We reviewed recommendations for regular mammograms and breast cancer screening.  Cervical cancer screening: We reviewed recommendations for pap smear screening.   Skin cancer screening: Check your skin regularly for new changes, growing lesions, or other lesions of  concern Come in for evaluation if you have skin lesions of concern.  Lung cancer screening: If you have a greater than 20 pack year history of tobacco use, then you may qualify for lung cancer screening with a chest CT scan.   Please call your insurance company to inquire about coverage for this test.  We currently don't have screenings for other cancers besides breast, cervical, colon, and lung cancers.  If you have a strong family history of cancer or have other cancer screening concerns, please let me know.    Bone health: Get at least 150 minutes of aerobic exercise weekly Get weight bearing exercise at least once weekly Bone density test:  A bone density test is an imaging test that uses a type of X-ray to measure the  amount of calcium and other minerals in your bones. The test may be used to diagnose or screen you for a condition that causes weak or thin bones (osteoporosis), predict your risk for a broken bone (fracture), or determine how well your osteoporosis treatment is working. The bone density test is recommended for females 65 and older, or females or males <65 if certain risk factors such as thyroid disease, long term use of steroids such as for asthma or rheumatological issues, vitamin D deficiency, estrogen deficiency, family history of osteoporosis, self or family history of fragility fracture in first degree relative.    Heart health: Get at least 150 minutes of aerobic exercise weekly Limit alcohol It is important to maintain a healthy blood pressure and healthy cholesterol numbers  Heart disease screening: Screening for heart disease includes screening for blood pressure, fasting lipids, glucose/diabetes screening, BMI height to weight ratio, reviewed of smoking status, physical activity, and diet.    Goals include blood pressure 120/80 or less, maintaining a healthy lipid/cholesterol profile, preventing diabetes or keeping diabetes numbers under good control, not smoking or using tobacco products, exercising most days per week or at least 150 minutes per week of exercise, and eating healthy variety of fruits and vegetables, healthy oils, and avoiding unhealthy food choices like fried food, fast food, high sugar and high cholesterol foods.    Other tests may possibly include EKG test, CT coronary calcium score, echocardiogram, exercise treadmill stress test.     Medical care options: I recommend you continue to seek care here first for routine care.  We try really hard to have available appointments Monday through Friday daytime hours for sick visits, acute visits, and physicals.  Urgent care should be used for after hours and weekends for significant issues that cannot wait till the next day.   The emergency department should be used for significant potentially life-threatening emergencies.  The emergency department is expensive, can often have long wait times for less significant concerns, so try to utilize primary care, urgent care, or telemedicine when possible to avoid unnecessary trips to the emergency department.  Virtual visits and telemedicine have been introduced since the pandemic started in 2020, and can be convenient ways to receive medical care.  We offer virtual appointments as well to assist you in a variety of options to seek medical care.    Separate significant issues discussed: Rosacea -has tried various topicals in the past.  She does not think she has tried this medicine before so she will begin trial of brimonidine topical  Palpitations-labs today, EKG reviewed.  Consider 8-day ZIO monitor  Thyromegaly-I reviewed her recent ultrasound.  No worrisome nodule.  Labs today  Paresthesias-pending labs, consider MRI  brain.  Lymph benign on right neck, reassured   Sharian "Karie Mainland" was seen today for annual exam.  Diagnoses and all orders for this visit:  Encounter for health maintenance examination in adult -     Comprehensive metabolic panel -     CBC -     Magnesium -     TSH + free T4 -     Lipid panel -     Vitamin B12 -     EKG 12-Lead  Screening for lipid disorders -     Lipid panel  Paresthesia -     Magnesium -     Vitamin B12  Rosacea  Palpitation -     EKG 12-Lead  Thyromegaly -     TSH + free T4  Other orders -     Brimonidine Tartrate 0.33 % GEL; Apply 1 Application topically daily.     Follow-up pending labs, yearly for physical

## 2023-04-12 ENCOUNTER — Other Ambulatory Visit: Payer: Self-pay | Admitting: Internal Medicine

## 2023-04-12 ENCOUNTER — Other Ambulatory Visit: Payer: Self-pay | Admitting: Medical

## 2023-04-12 ENCOUNTER — Ambulatory Visit: Payer: Managed Care, Other (non HMO) | Attending: Medical

## 2023-04-12 DIAGNOSIS — R002 Palpitations: Secondary | ICD-10-CM

## 2023-04-12 DIAGNOSIS — R202 Paresthesia of skin: Secondary | ICD-10-CM

## 2023-04-12 DIAGNOSIS — G4489 Other headache syndrome: Secondary | ICD-10-CM

## 2023-04-12 LAB — TSH+FREE T4
Free T4: 1.17 ng/dL (ref 0.82–1.77)
TSH: 0.516 u[IU]/mL (ref 0.450–4.500)

## 2023-04-12 LAB — COMPREHENSIVE METABOLIC PANEL
ALT: 7 [IU]/L (ref 0–32)
AST: 17 [IU]/L (ref 0–40)
Albumin: 4.5 g/dL (ref 3.9–4.9)
Alkaline Phosphatase: 57 [IU]/L (ref 44–121)
BUN/Creatinine Ratio: 13 (ref 9–23)
BUN: 12 mg/dL (ref 6–20)
Bilirubin Total: 0.7 mg/dL (ref 0.0–1.2)
CO2: 25 mmol/L (ref 20–29)
Calcium: 9.9 mg/dL (ref 8.7–10.2)
Chloride: 100 mmol/L (ref 96–106)
Creatinine, Ser: 0.89 mg/dL (ref 0.57–1.00)
Globulin, Total: 2.9 g/dL (ref 1.5–4.5)
Glucose: 89 mg/dL (ref 70–99)
Potassium: 4.5 mmol/L (ref 3.5–5.2)
Sodium: 139 mmol/L (ref 134–144)
Total Protein: 7.4 g/dL (ref 6.0–8.5)
eGFR: 88 mL/min/{1.73_m2} (ref >59)

## 2023-04-12 LAB — LIPID PANEL
Chol/HDL Ratio: 3.2 ratio (ref 0.0–4.4)
Cholesterol, Total: 188 mg/dL (ref 100–199)
HDL: 58 mg/dL (ref >39)
LDL Chol Calc (NIH): 113 mg/dL — ABNORMAL HIGH (ref 0–99)
Triglycerides: 91 mg/dL (ref 0–149)
VLDL Cholesterol Cal: 17 mg/dL (ref 5–40)

## 2023-04-12 LAB — VITAMIN B12: Vitamin B-12: 603 pg/mL (ref 232–1245)

## 2023-04-12 LAB — CBC
Hematocrit: 47.9 % — ABNORMAL HIGH (ref 34.0–46.6)
Hemoglobin: 15.2 g/dL (ref 11.1–15.9)
MCH: 29.6 pg (ref 26.6–33.0)
MCHC: 31.7 g/dL (ref 31.5–35.7)
MCV: 93 fL (ref 79–97)
Platelets: 177 10*3/uL (ref 150–450)
RBC: 5.13 x10E6/uL (ref 3.77–5.28)
RDW: 12.5 % (ref 11.7–15.4)
WBC: 5.1 10*3/uL (ref 3.4–10.8)

## 2023-04-12 LAB — MAGNESIUM: Magnesium: 2 mg/dL (ref 1.6–2.3)

## 2023-04-12 NOTE — Progress Notes (Unsigned)
 Enrolled patient for a 7 day Zio XT monitor to be mailed to patients home  EP to read.

## 2023-04-12 NOTE — Progress Notes (Signed)
Labs show normal blood counts, cholesterol okay, liver kidney electrolytes okay, magnesium okay, thyroid okay B12 okay  Please set up for zio week heart event monitor.  See if she wants to move forward with MRI brain?

## 2023-04-14 ENCOUNTER — Telehealth: Payer: Self-pay | Admitting: Medical

## 2023-04-14 NOTE — Telephone Encounter (Signed)
P.A. BRIMONIDINE GEL  KeyDonnal Debar) PA Case ID #: [id number] Rx #: [id number] Need Help? Call us at [phone] Status sent iconSent to Plan today Drug Brimonidine Tartrate 0.33% gel ePA cloud logo Chief Operating Officer PA Form

## 2023-04-21 DIAGNOSIS — R002 Palpitations: Secondary | ICD-10-CM

## 2023-05-04 NOTE — Telephone Encounter (Signed)
P.A. denied, doesn't give reason, will call plan

## 2023-05-09 NOTE — Progress Notes (Signed)
Results sent through MyChart

## 2023-05-13 ENCOUNTER — Telehealth: Payer: Self-pay | Admitting: Medical

## 2023-05-13 NOTE — Telephone Encounter (Signed)
Called plan & they wanted chart noted.  Had to resubmit another P.A.

## 2023-05-13 NOTE — Telephone Encounter (Signed)
Resubmitted P.A. Brimonidine Key: [id number]) PA Case ID #: [id number] Approved today by [place] [id number];FAOZHY:[id number];Review Type:Prior Auth;Coverage Start Date:04/13/2023;Coverage End Date:05/12/2024; Authorization Expiration Date: 05/11/2024 Drug Brimonidine Tartrate 0.33% gel Form Event organiser pt & informed, faxed pharmacy

## 2023-06-25 ENCOUNTER — Telehealth: Payer: Managed Care, Other (non HMO) | Admitting: Physician Assistant

## 2023-06-25 ENCOUNTER — Encounter: Payer: Self-pay | Admitting: Physician Assistant

## 2023-06-25 DIAGNOSIS — H66002 Acute suppurative otitis media without spontaneous rupture of ear drum, left ear: Secondary | ICD-10-CM

## 2023-06-25 DIAGNOSIS — J069 Acute upper respiratory infection, unspecified: Secondary | ICD-10-CM | POA: Diagnosis not present

## 2023-06-25 MED ORDER — AZITHROMYCIN 250 MG PO TABS: ORAL_TABLET | ORAL | 0 refills | Status: AC

## 2023-06-25 MED ORDER — FLUTICASONE PROPIONATE 50 MCG/ACT NA SUSP: 2.0000 | Freq: Every day | NASAL | 0 refills | Status: DC

## 2023-06-25 NOTE — Patient Instructions (Signed)
  Gina Avila, thank you for joining Gina Climes, PA-C for today's virtual visit.  While this provider is not your primary care provider (PCP), if your PCP is located in our provider database this encounter information will be shared with them immediately following your visit.   A [facility] MyChart account gives you access to today's visit and all your visits, tests, and labs performed at [facility] " click here if you don't have a [facility] MyChart account or go to [email or web address]  Consent: (Patient) Gina Avila provided verbal consent for this virtual visit at the beginning of the encounter.  Current Medications:  Current Outpatient Medications:    Brimonidine Tartrate 0.33 % GEL, Apply 1 Application topically daily., Disp: 30 g, Rfl: 2   Medications ordered in this encounter:  No orders of the defined types were placed in this encounter.    *If you need refills on other medications prior to your next appointment, please contact your pharmacy*  Follow-Up: Call back or seek an in-person evaluation if the symptoms worsen or if the condition fails to improve as anticipated.  [facility] [phone]  Other Instructions Please COVID test as directed and message with the results.  Keep hydrated and rest.  Ok to continue your OTC medications. Take the Flonase and Azithromycin as directed to treat secondary ear infection.  If COVID positive, we will discuss additional treatments and quarantine.   If you have been instructed to have an in-person evaluation today at a local Urgent Care facility, please use the link below. It will take you to a list of all of our available [facility], including address, phone number and hours of operation. Please do not delay care.  [facility]  If you or a family member do not have a primary care provider, use the link below to schedule a visit and  establish care. When you choose a [facility] primary care physician or advanced practice provider, you gain a long-term partner in health. Find a Primary Care Provider  Learn more about [facility]'s in-office and virtual care options: [facility] - Get Care Now

## 2023-06-25 NOTE — Progress Notes (Signed)
Virtual Visit Consent   Gina Avila, you are scheduled for a virtual visit with a [facility] provider today. Just as with appointments in the office, your consent must be obtained to participate. Your consent will be active for this visit and any virtual visit you may have with one of our providers in the next 365 days. If you have a MyChart account, a copy of this consent can be sent to you electronically.  As this is a virtual visit, video technology does not allow for your provider to perform a traditional examination. This may limit your provider's ability to fully assess your condition. If your provider identifies any concerns that need to be evaluated in person or the need to arrange testing (such as labs, EKG, etc.), we will make arrangements to do so. Although advances in technology are sophisticated, we cannot ensure that it will always work on either your end or our end. If the connection with a video visit is poor, the visit may have to be switched to a telephone visit. With either a video or telephone visit, we are not always able to ensure that we have a secure connection.  By engaging in this virtual visit, you consent to the provision of healthcare and authorize for your insurance to be billed (if applicable) for the services provided during this visit. Depending on your insurance coverage, you may receive a charge related to this service.  I need to obtain your verbal consent now. Are you willing to proceed with your visit today? Gina Avila has provided verbal consent on 06/25/2023 for a virtual visit (video or telephone). Piedad Climes, [place]  Date: 06/25/2023 10:15 AM  Virtual Visit via Video Note   I, Piedad Climes, connected with  Gina Avila  ([id number], [birth year]) on 06/25/23 at 10:15 AM EST by a video-enabled telemedicine application and verified that I am speaking with the correct person using two  identifiers.  Location: Patient: Virtual Visit Location Patient: Home Provider: Virtual Visit Location Provider: Home Office   I discussed the limitations of evaluation and management by telemedicine and the availability of in person appointments. The patient expressed understanding and agreed to proceed.    History of Present Illness: Gina Avila is a 33 y.o. who identifies as a female who was assigned female at birth, and is being seen today for 2 days of head congestion, cough with associated chest congestion. Notes some chest wall tenderness without SOB. Some back tenderness as well with coughing. Denies fever, chills, body aches. Noting with this L ear pressure and now constant pain. Denies symptoms of R ear. Denies recent travel. Son with similar symptoms. Has not tested for COVID. Has to get on a plane in a few days so very worried about the ear.   OTC -- Nyquil/Mucinex/Ibuprofen.  HPI: HPI  Problems:  Patient Active Problem List   Diagnosis Date Noted   Paresthesia 04/11/2023   Rosacea 04/11/2023   Palpitation 04/11/2023   Thyromegaly 04/11/2023   Encounter for health maintenance examination in adult 04/05/2022   Screening for lipid disorders 04/05/2022   Family history of thyroid disease 04/05/2022   Encounter for vitamin deficiency screening 04/05/2022   Needs flu shot 04/05/2022   Screening for hematuria or proteinuria 04/05/2022   Vitreous floaters of both eyes 04/05/2022   Headache syndrome 03/21/2018   History of panic attacks 01/13/2018   GERD (gastroesophageal reflux disease)    OCD (obsessive compulsive disorder)     Allergies:  Allergies  Allergen Reactions   Codeine Nausea And Vomiting    headaches   Latex Rash   Medications:  Current Outpatient Medications:    azithromycin (ZITHROMAX) 250 MG tablet, Take 2 tablets on day 1, then 1 tablet daily on days 2 through 5, Disp: 6 tablet, Rfl: 0   fluticasone (FLONASE) 50 MCG/ACT nasal spray, Place 2  sprays into both nostrils daily., Disp: 16 g, Rfl: 0  Observations/Objective: Patient is well-developed, well-nourished in no acute distress.  Resting comfortably at home.  Head is normocephalic, atraumatic.  No labored breathing. Speech is clear and coherent with logical content.  Patient is alert and oriented at baseline.   Assessment and Plan: 1. Viral URI with cough (Primary) - fluticasone (FLONASE) 50 MCG/ACT nasal spray; Place 2 sprays into both nostrils daily.  Dispense: 16 g; Refill: 0  2. Non-recurrent acute suppurative otitis media of left ear without spontaneous rupture of tympanic membrane - fluticasone (FLONASE) 50 MCG/ACT nasal spray; Place 2 sprays into both nostrils daily.  Dispense: 16 g; Refill: 0 - azithromycin (ZITHROMAX) 250 MG tablet; Take 2 tablets on day 1, then 1 tablet daily on days 2 through 5  Dispense: 6 tablet; Refill: 0  Instructed to COVID test as a precautions, especially giving upcoming travel. She is to let [place] know ASAP if positive. Supportive measures and OTC medications reviewed. Concern for secondary AOM and giving upcoming flight will be more aggressive with treatment versus monitoring. Rx Azithromycin and Flonase.   Follow Up Instructions: I discussed the assessment and treatment plan with the patient. The patient was provided an opportunity to ask questions and all were answered. The patient agreed with the plan and demonstrated an understanding of the instructions.  A copy of instructions were sent to the patient via MyChart unless otherwise noted below.   The patient was advised to call back or seek an in-person evaluation if the symptoms worsen or if the condition fails to improve as anticipated.    Piedad Climes, PA-C

## 2024-04-16 ENCOUNTER — Ambulatory Visit: Payer: Managed Care, Other (non HMO) | Admitting: Medical

## 2024-04-16 ENCOUNTER — Encounter: Payer: Self-pay | Admitting: Medical

## 2024-04-16 VITALS — BP 122/78 | HR 66 | Ht 65.25 in | Wt 133.2 lb

## 2024-04-16 DIAGNOSIS — Z1321 Encounter for screening for nutritional disorder: Secondary | ICD-10-CM | POA: Diagnosis not present

## 2024-04-16 DIAGNOSIS — Z13220 Encounter for screening for lipoid disorders: Secondary | ICD-10-CM

## 2024-04-16 DIAGNOSIS — Z1389 Encounter for screening for other disorder: Secondary | ICD-10-CM

## 2024-04-16 DIAGNOSIS — R0989 Other specified symptoms and signs involving the circulatory and respiratory systems: Secondary | ICD-10-CM

## 2024-04-16 DIAGNOSIS — L719 Rosacea, unspecified: Secondary | ICD-10-CM

## 2024-04-16 DIAGNOSIS — Z8349 Family history of other endocrine, nutritional and metabolic diseases: Secondary | ICD-10-CM

## 2024-04-16 DIAGNOSIS — K644 Residual hemorrhoidal skin tags: Secondary | ICD-10-CM

## 2024-04-16 DIAGNOSIS — Z0000 Encounter for general adult medical examination without abnormal findings: Secondary | ICD-10-CM | POA: Diagnosis not present

## 2024-04-16 DIAGNOSIS — E010 Iodine-deficiency related diffuse (endemic) goiter: Secondary | ICD-10-CM | POA: Diagnosis not present

## 2024-04-16 MED ORDER — HYDROCORTISONE 2.5 % EX CREA: TOPICAL_CREAM | Freq: Two times a day (BID) | CUTANEOUS | 0 refills | Status: AC

## 2024-04-16 NOTE — Progress Notes (Signed)
 Subjective:   HPI  Gina Avila is a 33 y.o. female who presents for Chief Complaint  Patient presents with   Annual Exam    Fasting cpe, just have a lump on right side of neck and spot on face. I have requested updated pap from obgyn     Patient Care Team: Harmonii Karle, Gina RAMAN, PA-C as PCP - [facility], [facility], [facility] [place] Sees dentist Dr. Renaye Sous, GI Dr. Arley Helling, Gina Pean, NP neurosurgery   Concerns: Gina Avila is a 33 year old female who presents with concerns about a neck bump, a new forehead spot, and hemorrhoids.  She has a persistent bump on the side of her neck that was present last year and remains unchanged.  She notes a new spot on her forehead, which is not raised but different from her usual skin appearance. She is concerned about this change.  She experiences hemorrhoids that occasionally bleed, with two instances of minimal blood in her stool months ago. There is no pain, but she can feel them when walking. They tend to 'pop out and pop in.' No constipation or straining during bowel movements is reported, and she does not use any treatment for the hemorrhoids prior  She has a family history of thyroid  disease in her mother, grandmother, and uncle. She does not smoke, consumes alcohol infrequently, about one drink every two weeks, engages in Pilates twice a week, and walks daily around the school. Occasionally, she uses resistance bands for weight-bearing exercises.  Reviewed their medical, surgical, family, social, medication, and allergy history and updated chart as appropriate.   Allergies  Allergen Reactions   Codeine Nausea And Vomiting    headaches   Latex Rash    Past Medical History:  Diagnosis Date   GERD (gastroesophageal reflux disease)    History of breast lump    OCD (obsessive compulsive disorder)     No current outpatient medications on file prior to visit.    No current facility-administered medications on file prior to visit.      Current Outpatient Medications:    hydrocortisone 2.5 % cream, Apply topically 2 (two) times daily., Disp: 30 g, Rfl: 0  Family History  Problem Relation Age of Onset   Breast cancer Mother 41   Thyroid  disease Mother    Hypertension Father    CAD Father 26   Thyroid  disease Maternal Uncle    Breast cancer Paternal Aunt    Thyroid  disease Maternal Grandmother    Osteoporosis Maternal Grandmother    Stroke Paternal Grandmother 13    Past Surgical History:  Procedure Laterality Date   CERVICAL DISC SURGERY  05/2018   [facility]   ESOPHAGOGASTRODUODENOSCOPY     Dr. Sous   WISDOM TOOTH EXTRACTION      Review of Systems  Constitutional:  Negative for chills, fever, malaise/fatigue and weight loss.  HENT:  Negative for congestion, ear pain, hearing loss, sore throat and tinnitus.   Eyes:  Negative for blurred vision, pain and redness.  Respiratory:  Negative for cough, hemoptysis and shortness of breath.   Cardiovascular:  Negative for chest pain, palpitations, orthopnea, claudication and leg swelling.  Gastrointestinal:  Negative for abdominal pain, blood in stool, constipation, diarrhea, nausea and vomiting.  Genitourinary:  Negative for dysuria, flank pain, frequency, hematuria and urgency.  Musculoskeletal:  Negative for falls, joint pain and myalgias.  Skin:  Negative for itching and rash.  Neurological:  Negative for dizziness,  tingling, speech change, weakness and headaches.  Endo/Heme/Allergies:  Negative for polydipsia. Does not bruise/bleed easily.  Psychiatric/Behavioral:  Negative for depression and memory loss. The patient is not nervous/anxious and does not have insomnia.        04/16/2024    3:19 PM 04/11/2023    9:46 AM 04/05/2022   10:17 AM 10/10/2020    8:38 AM 09/29/2020   12:04 PM  Depression screen PHQ 2/9  Decreased Interest 0 0 0 0 0  Down, Depressed, Hopeless 0 0  0 0 0  PHQ - 2 Score 0 0 0 0 0       Objective:  BP 122/78   Pulse 66   Ht 5' 5.25 (1.657 m)   Wt 133 lb 3.2 oz (60.4 kg)   SpO2 100%   BMI 22.00 kg/m   Wt Readings from Last 3 Encounters:  04/16/24 133 lb 3.2 oz (60.4 kg)  04/11/23 130 lb (59 kg)  12/27/22 128 lb 12.8 oz (58.4 kg)   General appearance: alert, no distress, WD/WN, Caucasian female Skin: rosy red tip of nose,  no papules or pustules, otherwise unremarkable, small 3mm diameter flat brown macule mid scalp, just within hairline. HEENT: normocephalic, conjunctiva/corneas normal, sclerae anicteric, PERRLA, EOMi, nares patent, no discharge or erythema, pharynx normal Oral cavity: MMM, tongue normal, teeth in good repair Neck: supple, palpable small lymph nodes posterior cervical, but  no lymphadenopathy, no thyromegaly, no masses, normal ROM, no bruits Chest: non tender, normal shape and expansion Heart: RRR, normal S1, S2, no murmurs Lungs: CTA bilaterally, no wheezes, rhonchi, or rales Abdomen: +bs, soft, non tender, non distended, no masses, no hepatomegaly, no splenomegaly, no bruits Back: non tender, normal ROM, no scoliosis Musculoskeletal:  upper extremities non tender, no obvious deformity, normal ROM throughout, lower extremities non tender, no obvious deformity, normal ROM throughout Extremities: no edema, no cyanosis, no clubbing Pulses: 2+ symmetric, upper and lower extremities, normal cap refill Neurological: alert, oriented x 3, CN2-12 intact, strength normal upper extremities and lower extremities, sensation normal throughout, DTRs 2+ throughout, no cerebellar signs, gait normal Psychiatric: normal affect, behavior normal, pleasant  Breast/gyn/rectal - deferred to gynecology  Rectal - declined   Assessment and Plan :   Encounter Diagnoses  Name Primary?   Encounter for health maintenance examination in adult Yes   Encounter for vitamin deficiency screening    Thyromegaly    Family history of  thyroid  disease    Screening for lipid disorders    Screening for hematuria or proteinuria    Rosacea    External hemorrhoid    Lymph node symptom     This visit was a preventative care visit, also known as wellness visit or routine physical.   Topics typically include healthy lifestyle, diet, exercise, preventative care, vaccinations, sick and well care, proper use of emergency dept and after hours care, as well as other concerns.     Recommendations: Continue to return yearly for your annual wellness and preventative care visits.  This gives us  a chance to discuss healthy lifestyle, exercise, vaccinations, review your chart record, and perform screenings where appropriate.  I recommend you see your eye doctor yearly for routine vision care.  I recommend you see your dentist yearly for routine dental care including hygiene visits twice yearly.   Vaccination recommendations were reviewed Immunization History  Administered Date(s) Administered   Influenza, Seasonal, Injecte, Preservative Fre 03/16/2016   Influenza,inj,Quad PF,6+ Mos 03/11/2017, 04/14/2018, 04/05/2022   Influenza,inj,quad, With Preservative 02/17/2019  Influenza-Unspecified 03/16/2016, 03/11/2017, 03/04/2024   Moderna Sars-Covid-2 Vaccination 06/10/2020   PFIZER(Purple Top)SARS-COV-2 Vaccination 07/18/2019, 08/12/2019   PPD Test 05/16/2023   Tdap 02/16/2020, 03/04/2020     Screening for cancer: Colon cancer screening: Age 61  Breast cancer screening: You should perform a self breast exam monthly.   We reviewed recommendations for regular mammograms and breast cancer screening.  Cervical cancer screening: We reviewed recommendations for pap smear screening.   Skin cancer screening: Check your skin regularly for new changes, growing lesions, or other lesions of concern Come in for evaluation if you have skin lesions of concern.  Lung cancer screening: If you have a greater than 20 pack year history of  tobacco use, then you may qualify for lung cancer screening with a chest CT scan.   Please call your insurance company to inquire about coverage for this test.  We currently don't have screenings for other cancers besides breast, cervical, colon, and lung cancers.  If you have a strong family history of cancer or have other cancer screening concerns, please let me know.    Bone health: Get at least 150 minutes of aerobic exercise weekly Get weight bearing exercise at least once weekly Bone density test:  A bone density test is an imaging test that uses a type of X-ray to measure the amount of calcium and other minerals in your bones. The test may be used to diagnose or screen you for a condition that causes weak or thin bones (osteoporosis), predict your risk for a broken bone (fracture), or determine how well your osteoporosis treatment is working. The bone density test is recommended for females 65 and older, or females or males <65 if certain risk factors such as thyroid  disease, long term use of steroids such as for asthma or rheumatological issues, vitamin D  deficiency, estrogen deficiency, family history of osteoporosis, self or family history of fragility fracture in first degree relative.    Heart health: Get at least 150 minutes of aerobic exercise weekly Limit alcohol It is important to maintain a healthy blood pressure and healthy cholesterol numbers  Heart disease screening: Screening for heart disease includes screening for blood pressure, fasting lipids, glucose/diabetes screening, BMI height to weight ratio, reviewed of smoking status, physical activity, and diet.    Goals include blood pressure 120/80 or less, maintaining a healthy lipid/cholesterol profile, preventing diabetes or keeping diabetes numbers under good control, not smoking or using tobacco products, exercising most days per week or at least 150 minutes per week of exercise, and eating healthy variety of fruits and  vegetables, healthy oils, and avoiding unhealthy food choices like fried food, fast food, high sugar and high cholesterol foods.    Other tests may possibly include EKG test, CT coronary calcium score, echocardiogram, exercise treadmill stress test.     Medical care options: I recommend you continue to seek care here first for routine care.  We try really hard to have available appointments Monday through Friday daytime hours for sick visits, acute visits, and physicals.  Urgent care should be used for after hours and weekends for significant issues that cannot wait till the next day.  The emergency department should be used for significant potentially life-threatening emergencies.  The emergency department is expensive, can often have long wait times for less significant concerns, so try to utilize primary care, urgent care, or telemedicine when possible to avoid unnecessary trips to the emergency department.  Virtual visits and telemedicine have been introduced since the pandemic started  in 2020, and can be convenient ways to receive medical care.  We offer virtual appointments as well to assist you in a variety of options to seek medical care.    Separate significant issues discussed: Rosacea -follow up with dermatology  Thyromegaly-I reviewed her ultrasound from 01/2023.  No worrisome nodule.  Labs today  Lymph benign on right neck, reassured  We discusesd external hemorrhoids  Discussed possible causes of hemorrhoids including constipation, straining, diarrhea, pregnancy, obesity, heavy lifting, and sitting for long periods.  Discussed prevention, treatment for acute flare ups, and possible complications or worsening symptoms.  Discussed hygiene, SITZ baths, diet and exercise. Rx for Hydrocortisone 2.5% cream today.  You can apply this topically to the external hemorrhoid twice daily short term for 3-5 days at a time as needed for itching, irritations, or swelling  Jamae Tison was seen  today for annual exam.  Diagnoses and all orders for this visit:  Encounter for health maintenance examination in adult -     CBC -     Comprehensive metabolic panel with GFR -     Lipid panel -     Vitamin D , 25-hydroxy -     Urinalysis, Routine w reflex microscopic -     TSH + free T4  Encounter for vitamin deficiency screening -     Vitamin D , 25-hydroxy  Thyromegaly -     TSH + free T4  Family history of thyroid  disease -     TSH + free T4  Screening for lipid disorders -     Lipid panel  Screening for hematuria or proteinuria -     Urinalysis, Routine w reflex microscopic  Rosacea  External hemorrhoid  Lymph node symptom  Other orders -     hydrocortisone 2.5 % cream; Apply topically 2 (two) times daily.    Follow-up pending labs, yearly for physical

## 2024-04-17 ENCOUNTER — Ambulatory Visit: Payer: Self-pay | Admitting: Medical

## 2024-04-17 LAB — COMPREHENSIVE METABOLIC PANEL WITH GFR
ALT: 5 IU/L (ref 0–32)
AST: 14 IU/L (ref 0–40)
Albumin: 4.4 g/dL (ref 3.9–4.9)
Alkaline Phosphatase: 51 IU/L (ref 41–116)
BUN/Creatinine Ratio: 16 (ref 9–23)
BUN: 14 mg/dL (ref 6–20)
Bilirubin Total: 0.3 mg/dL (ref 0.0–1.2)
CO2: 24 mmol/L (ref 20–29)
Calcium: 9.2 mg/dL (ref 8.7–10.2)
Chloride: 102 mmol/L (ref 96–106)
Creatinine, Ser: 0.9 mg/dL (ref 0.57–1.00)
Globulin, Total: 2.6 g/dL (ref 1.5–4.5)
Glucose: 88 mg/dL (ref 70–99)
Potassium: 4.3 mmol/L (ref 3.5–5.2)
Sodium: 139 mmol/L (ref 134–144)
Total Protein: 7 g/dL (ref 6.0–8.5)
eGFR: 86 mL/min/1.73 (ref >59)

## 2024-04-17 LAB — CBC
Hematocrit: 40.7 % (ref 34.0–46.6)
Hemoglobin: 13.5 g/dL (ref 11.1–15.9)
MCH: 30.1 pg (ref 26.6–33.0)
MCHC: 33.2 g/dL (ref 31.5–35.7)
MCV: 91 fL (ref 79–97)
Platelets: 176 x10E3/uL (ref 150–450)
RBC: 4.48 x10E6/uL (ref 3.77–5.28)
RDW: 12 % (ref 11.7–15.4)
WBC: 7.2 x10E3/uL (ref 3.4–10.8)

## 2024-04-17 LAB — LIPID PANEL
Chol/HDL Ratio: 3.1 ratio (ref 0.0–4.4)
Cholesterol, Total: 162 mg/dL (ref 100–199)
HDL: 53 mg/dL (ref >39)
LDL Chol Calc (NIH): 85 mg/dL (ref 0–99)
Triglycerides: 137 mg/dL (ref 0–149)
VLDL Cholesterol Cal: 24 mg/dL (ref 5–40)

## 2024-04-17 LAB — URINALYSIS, ROUTINE W REFLEX MICROSCOPIC
Specific Gravity, UA: 1.013 (ref 1.005–1.030)
Urobilinogen, Ur: 0.2 mg/dL (ref 0.2–1.0)
pH, UA: 6.5 (ref 5.0–7.5)

## 2024-04-17 LAB — VITAMIN D 25 HYDROXY (VIT D DEFICIENCY, FRACTURES): Vit D, 25-Hydroxy: 27.9 ng/mL — AB (ref 30.0–100.0)

## 2024-04-17 LAB — TSH+FREE T4
Free T4: 0.99 ng/dL (ref 0.82–1.77)
TSH: 0.691 u[IU]/mL (ref 0.450–4.500)

## 2024-04-17 NOTE — Progress Notes (Signed)
 Results to MyChart

## 2025-04-19 ENCOUNTER — Encounter: Admitting: Medical
